# Patient Record
Sex: Female | Born: 1990 | Race: White | Hispanic: No | Marital: Married | Smoking: Never smoker
Health system: Southern US, Community
[De-identification: ages and names within clinical notes are randomized; demographics above are authoritative.]

## PROBLEM LIST (undated history)

## (undated) ENCOUNTER — Inpatient Hospital Stay (HOSPITAL_COMMUNITY): Payer: BC Managed Care – PPO

## (undated) DIAGNOSIS — N200 Calculus of kidney: Secondary | ICD-10-CM

## (undated) DIAGNOSIS — G43909 Migraine, unspecified, not intractable, without status migrainosus: Secondary | ICD-10-CM

## (undated) HISTORY — DX: Calculus of kidney: N20.0

## (undated) HISTORY — PX: WISDOM TOOTH EXTRACTION: SHX21

## (undated) HISTORY — DX: Migraine, unspecified, not intractable, without status migrainosus: G43.909

---
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
## 2011-03-12 LAB — PREGNANCY, URINE: Preg Test, Ur: NEGATIVE

## 2011-07-02 ENCOUNTER — Ambulatory Visit (INDEPENDENT_AMBULATORY_CARE_PROVIDER_SITE_OTHER): Payer: 59

## 2011-07-02 DIAGNOSIS — Z309 Encounter for contraceptive management, unspecified: Secondary | ICD-10-CM

## 2011-07-02 DIAGNOSIS — F988 Other specified behavioral and emotional disorders with onset usually occurring in childhood and adolescence: Secondary | ICD-10-CM

## 2011-07-13 ENCOUNTER — Encounter (HOSPITAL_COMMUNITY): Payer: Self-pay

## 2011-07-13 ENCOUNTER — Emergency Department (HOSPITAL_COMMUNITY)
Admission: EM | Admit: 2011-07-13 | Discharge: 2011-07-13 | Disposition: A | Discharge status: Home / Self Care | Payer: Self-pay | Source: Home / Self Care | Attending: Family Medicine | Admitting: Family Medicine

## 2011-07-13 DIAGNOSIS — B009 Herpesviral infection, unspecified: Secondary | ICD-10-CM

## 2011-07-13 MED ORDER — VALACYCLOVIR HCL 1 G PO TABS: 1000.0000 mg | ORAL_TABLET | Freq: Three times a day (TID) | ORAL | Status: DC

## 2011-07-13 MED ORDER — VALACYCLOVIR HCL 1 G PO TABS: 1000.0000 mg | ORAL_TABLET | Freq: Three times a day (TID) | ORAL | Status: AC

## 2011-07-13 NOTE — ED Provider Notes (Signed)
History     CSN: [id number]  Arrival date & time 07/13/11  0944   None     No chief complaint on file.   (Consider location/radiation/quality/duration/timing/severity/associated sxs/prior treatment) HPI Comments: Jamie Simmons presents for evaluation of a recurrent skin rash. She reports, that she's had recurrences of the rash multiple times over the last 3 years. She reports that the rash starts as a simple pimple, but then worsens into crusting vesicular lesions that itch. She reports having fever blisters once around her mouth when a friend gave her acyclovir at help. She denies any fever.  Patient is a 21 y.o. female presenting with rash. The history is provided by the patient.  Rash  This is a recurrent problem. The current episode started more than 2 days ago. The problem has not changed since onset.The problem is associated with nothing. There has been no fever. The rash is present on the left hand, abdomen, left arm and right arm. The patient is experiencing no pain. Associated symptoms include blisters, itching and weeping. Pertinent negatives include no pain. She has tried antihistamines and steriods for the symptoms. The treatment provided no relief.    No past medical history on file.  No past surgical history on file.  No family history on file.  History  Substance Use Topics  . Smoking status: Not on file  . Smokeless tobacco: Not on file  . Alcohol Use: Not on file    OB History    No data available      Review of Systems  Constitutional: Negative.   HENT: Negative.   Eyes: Negative.   Respiratory: Negative.   Cardiovascular: Negative.   Gastrointestinal: Negative.   Genitourinary: Negative.   Musculoskeletal: Negative.   Skin: Positive for itching and rash.  Neurological: Negative.     Allergies  Review of patient's allergies indicates not on file.  Home Medications   Current Outpatient Rx  Name Route Sig Dispense Refill  . VALACYCLOVIR HCL 1 G PO  TABS Oral Take 1 tablet (1,000 mg total) by mouth 3 (three) times daily. For 3 to 5 days 30 tablet 1    BP 116/81  Pulse 81  Temp(Src) 98.9 F (37.2 C) (Oral)  Resp 16  SpO2 100%  Physical Exam  Nursing note and vitals reviewed. Constitutional: She is oriented to person, place, and time. She appears well-developed and well-nourished.  HENT:  Head: Normocephalic and atraumatic.  Eyes: EOM are normal.  Neck: Normal range of motion.  Pulmonary/Chest: Effort normal.  Musculoskeletal: Normal range of motion.  Neurological: She is alert and oriented to person, place, and time.  Skin: Skin is warm and dry. Rash noted. Rash is vesicular.     Psychiatric: Her behavior is normal.    ED Course  Procedures (including critical care time)  Labs Reviewed - No data to display No results found.   1. Herpes simplex type 1 infection       MDM  rx given for valacyclovir PRN outbreaks        Richardo Priest, MD 07/13/11 1153

## 2011-07-13 NOTE — ED Notes (Signed)
Pt was seen by Dr Juanetta Gosling and discharged at time of triage at 1200

## 2011-07-13 NOTE — ED Notes (Signed)
Pt here for recurrent vesicular rash to lt hand, forearms and rt hip area.

## 2011-08-02 ENCOUNTER — Telehealth: Payer: Self-pay

## 2011-08-02 MED ORDER — AMPHETAMINE-DEXTROAMPHETAMINE 15 MG PO TABS: 15.0000 mg | ORAL_TABLET | Freq: Two times a day (BID) | ORAL | Status: DC

## 2011-08-02 NOTE — Telephone Encounter (Signed)
Notified pt Rx ready for p/up 

## 2011-08-02 NOTE — Telephone Encounter (Signed)
Chelle, do you want to increase strength?

## 2011-08-02 NOTE — Telephone Encounter (Signed)
New rx written, at 15 mg BID dose.  Please advise patient that she needs an OV for her next fill.

## 2011-08-02 NOTE — Telephone Encounter (Signed)
.  [facility]     PT REQUESTING REFILL FOR ADDERALL,ALSO WANTS TO CHANGE DOSAGE FROM 10 TO 15 MG TWICE DAILY,   BEST PHONE [phone] TO LEAVE MESSAGE

## 2011-08-30 ENCOUNTER — Telehealth: Payer: Self-pay

## 2011-08-30 NOTE — Telephone Encounter (Signed)
PT REQUESTING ADDERALL REFILL    BEST PHONE [phone]

## 2011-08-30 NOTE — Telephone Encounter (Signed)
Please pull chart and send message to pa pool. Thanks

## 2011-08-30 NOTE — Telephone Encounter (Signed)
Patient only seen in Epic

## 2011-09-01 MED ORDER — AMPHETAMINE-DEXTROAMPHETAMINE 15 MG PO TABS: 15.0000 mg | ORAL_TABLET | Freq: Two times a day (BID) | ORAL | Status: DC

## 2011-09-01 NOTE — Telephone Encounter (Signed)
Notified that Rx is ready for p/up

## 2011-09-01 NOTE — Telephone Encounter (Signed)
Signed at [employer].  Will need OV next month.

## 2011-09-01 NOTE — Telephone Encounter (Signed)
.  [facility] Patient's mother called to state that patient is out of Adderall and has none for school tomorrow.  Patient's mother states that patient normally sees Porfirio Oar, [facility] or Dr. Merla Riches.  Patient may be reached at [phone] regarding this matter.

## 2011-09-29 ENCOUNTER — Ambulatory Visit: Payer: 59

## 2011-09-29 ENCOUNTER — Ambulatory Visit (INDEPENDENT_AMBULATORY_CARE_PROVIDER_SITE_OTHER): Payer: 59 | Admitting: Family Medicine

## 2011-09-29 VITALS — BP 116/79 | HR 76 | Temp 99.2°F | Resp 16 | Ht 64.75 in | Wt 178.0 lb

## 2011-09-29 DIAGNOSIS — Z3049 Encounter for surveillance of other contraceptives: Secondary | ICD-10-CM

## 2011-09-29 DIAGNOSIS — M25569 Pain in unspecified knee: Secondary | ICD-10-CM

## 2011-09-29 DIAGNOSIS — Z3042 Encounter for surveillance of injectable contraceptive: Secondary | ICD-10-CM

## 2011-09-29 DIAGNOSIS — F988 Other specified behavioral and emotional disorders with onset usually occurring in childhood and adolescence: Secondary | ICD-10-CM | POA: Insufficient documentation

## 2011-09-29 MED ORDER — AMPHETAMINE-DEXTROAMPHETAMINE 15 MG PO TABS: 15.0000 mg | ORAL_TABLET | Freq: Every day | ORAL | Status: DC

## 2011-09-29 MED ORDER — MEDROXYPROGESTERONE ACETATE 150 MG/ML IM SUSP
150.0000 mg | Freq: Once | INTRAMUSCULAR | Status: AC
  Administered 2011-09-29: 150 mg via INTRAMUSCULAR

## 2011-09-29 MED ORDER — MELOXICAM 15 MG PO TABS: 15.0000 mg | ORAL_TABLET | Freq: Every day | ORAL | Status: DC

## 2011-09-29 MED ORDER — AMPHETAMINE-DEXTROAMPHETAMINE 15 MG PO TABS: 15.0000 mg | ORAL_TABLET | Freq: Two times a day (BID) | ORAL | Status: DC

## 2011-09-29 NOTE — Patient Instructions (Signed)
If your knee pain is not improved with this medication, please let me know, so I can refer to orthopedics.

## 2011-09-29 NOTE — Progress Notes (Signed)
  Subjective:    Patient ID: Jamie Simmons, female    DOB: 08-07-1990, 21 y.o.   MRN: [id number]  HPI  Presents for refills  Heavy bleeding x 2 weeks each month, worst cramps "ever." Flow is a little less than before.  Mood swings seem somewhat improved, but she's not really sure.  Happy with current dose of Adderall.  Has four days of classes left this semester.  Left knee pain x 4 weeks after crossing the left leg over the right "hard" while sitting in the car.  The pain can be present at rest, and with activity, though doesn't always worsen with activity.  Advil without benefit ("I think I'm immune to it.  I took it a lot when I was younger. My doctor prescribed it for everything.").  Review of Systems As above.     Objective:   Physical Exam  Vital signs noted. Well-developed, well nourished WF who is awake, alert and oriented, in NAD. HEENT: [place]/AT, sclera and conjunctiva are clear.   Extremities: no cyanosis, clubbing or edema. No erythema.Tenderness with palpation of the lateral knee, worst along the lower patellar margin. No crepitus. FROM without pain. Skin: warm and dry without rash.  Left Knee: [facility] reading (PRIMARY) by  Dr. Milus Glazier. Normal knee.       Assessment & Plan:   1. Encounter for Depo-Provera contraception  medroxyPROGESTERone (DEPO-PROVERA) injection 150 mg, repeat 3 months  2. ADD (attention deficit disorder)  amphetamine-dextroamphetamine (ADDERALL, 15MG ,) 15 MG tablet, 3 rx's written. Re-eval in 3 months  3. Knee pain  DG Knee Complete 4 Views Left, meloxicam (MOBIC) 15 MG tablet; if not improving with quad strengthening and meloxicam, call.  Will refer to orthopedics.

## 2011-10-29 ENCOUNTER — Telehealth: Payer: Self-pay

## 2011-10-29 ENCOUNTER — Encounter (HOSPITAL_COMMUNITY): Payer: Self-pay | Admitting: Emergency Medicine

## 2011-10-29 ENCOUNTER — Emergency Department (HOSPITAL_COMMUNITY): Payer: 59

## 2011-10-29 ENCOUNTER — Emergency Department (HOSPITAL_COMMUNITY)
Admission: EM | Admit: 2011-10-29 | Discharge: 2011-10-30 | Disposition: A | Discharge status: Home / Self Care | Payer: 59 | Attending: Emergency Medicine | Admitting: Emergency Medicine

## 2011-10-29 DIAGNOSIS — Z79899 Other long term (current) drug therapy: Secondary | ICD-10-CM | POA: Insufficient documentation

## 2011-10-29 DIAGNOSIS — R51 Headache: Secondary | ICD-10-CM | POA: Insufficient documentation

## 2011-10-29 DIAGNOSIS — R200 Anesthesia of skin: Secondary | ICD-10-CM

## 2011-10-29 DIAGNOSIS — M549 Dorsalgia, unspecified: Secondary | ICD-10-CM | POA: Insufficient documentation

## 2011-10-29 DIAGNOSIS — R42 Dizziness and giddiness: Secondary | ICD-10-CM | POA: Insufficient documentation

## 2011-10-29 DIAGNOSIS — F988 Other specified behavioral and emotional disorders with onset usually occurring in childhood and adolescence: Secondary | ICD-10-CM | POA: Insufficient documentation

## 2011-10-29 DIAGNOSIS — R079 Chest pain, unspecified: Secondary | ICD-10-CM | POA: Insufficient documentation

## 2011-10-29 DIAGNOSIS — R209 Unspecified disturbances of skin sensation: Secondary | ICD-10-CM | POA: Insufficient documentation

## 2011-10-29 DIAGNOSIS — H538 Other visual disturbances: Secondary | ICD-10-CM | POA: Insufficient documentation

## 2011-10-29 LAB — POCT I-STAT, CHEM 8
BUN: 9 mg/dL (ref 6–23)
Calcium, Ion: 1.24 mmol/L (ref 1.12–1.32)
HCT: 42 % (ref 36.0–46.0)
Hemoglobin: 14.3 g/dL (ref 12.0–15.0)
TCO2: 25 mmol/L (ref 0–100)

## 2011-10-29 LAB — POCT I-STAT TROPONIN I: Troponin i, poc: 0 ng/mL (ref 0.00–0.08)

## 2011-10-29 LAB — POCT PREGNANCY, URINE: Preg Test, Ur: NEGATIVE

## 2011-10-29 MED ORDER — AMPHETAMINE-DEXTROAMPHETAMINE 15 MG PO TABS: 15.0000 mg | ORAL_TABLET | Freq: Two times a day (BID) | ORAL | Status: DC

## 2011-10-29 NOTE — Telephone Encounter (Signed)
PATIENTS MOTHER CALLED ABOUT HER DAUGHTERS ADDERALL PRESCRIPTION.  SAYS THE FIRST ONE IS CORRECT, BUT THE 2ND TWO HAVE THE WRONG DIRECTIONS ON IT.  THE QUANTITY IS CORRECT, BUT IT SAYS TO TAKE 1 PER DAY.  SHE IS SUPPOSED TO TAKE TWO PER DAY.

## 2011-10-29 NOTE — Telephone Encounter (Signed)
Patient brought in original Rx's that had the incorrect sig. Rx's corrected and exchanged.

## 2011-10-29 NOTE — ED Provider Notes (Signed)
History     CSN: [id number]  Arrival date & time 10/29/11  6578   First MD Initiated Contact with Patient 10/29/11 2203      Chief Complaint  Patient presents with  . Chest Pain    (Consider location/radiation/quality/duration/timing/severity/associated sxs/prior treatment) HPI This 21 year old female has had about 4 episodes of left-sided facial numbness of the last month. Her prior 3 episodes were gradual in onset lasted a few hours and resolved. This current spell started yesterday gradually during the day and has been present all day yesterday and all day today with slight numbness to either partial or the entire left side of her face waxing and waning in its pattern and associated with only very slight headache gradual onset today which is essentially gone now. There is no sudden or severe headache. There's been no change in speech vision swallowing or understanding. There is no weakness or numbness or incoordination to her arms or legs. She is walking normally. She does however have 2 days of a general lightheaded feeling which is not associated with any change in position or exertion or activity. She also today has about 4 hours of a sudden onset and mild sharp chest pain over her entire chest radiating to her back with no palpitations cough fever shortness of breath nausea or diaphoresis. She has no pleuritic positional or exertional component. She has no leg pains or leg swelling or leg tenderness. She has no travel or trauma. She is PERC negative. Past Medical History  Diagnosis Date  . Attention deficit disorder     History reviewed. No pertinent past surgical history.  No family history on file.  History  Substance Use Topics  . Smoking status: Never Smoker   . Smokeless tobacco: Not on file  . Alcohol Use: No    OB History    Grav Para Term Preterm Abortions TAB SAB Ect Mult Living                  Review of Systems  Constitutional: Negative for fever.       10  Systems reviewed and are negative for acute change except as noted in the HPI.  HENT: Negative for congestion.   Eyes: Negative for discharge and redness.  Respiratory: Negative for cough and shortness of breath.   Cardiovascular: Positive for chest pain.  Gastrointestinal: Negative for vomiting and abdominal pain.  Musculoskeletal: Negative for back pain.  Skin: Negative for rash.  Neurological: Positive for numbness and headaches. Negative for syncope.  Psychiatric/Behavioral:       No behavior change.    Allergies  Review of patient's allergies indicates no known allergies.  Home Medications   Current Outpatient Rx  Name Route Sig Dispense Refill  . AMPHETAMINE-DEXTROAMPHETAMINE 15 MG PO TABS Oral Take 1 tablet (15 mg total) by mouth 2 (two) times daily. 60 tablet 0  . MEDROXYPROGESTERONE ACETATE 150 MG/ML IM SUSP Intramuscular Inject 150 mg into the muscle every 3 (three) months.      BP 127/67  Pulse 94  Temp(Src) 98.9 F (37.2 C) (Oral)  Resp 20  Ht 5\' 6"  (1.676 m)  Wt 170 lb (77.111 kg)  BMI 27.44 kg/m2  SpO2 100%  Physical Exam  Nursing note and vitals reviewed. Constitutional:       Awake, alert, nontoxic appearance with baseline speech for patient.  HENT:  Head: Atraumatic.  Mouth/Throat: No oropharyngeal exudate.  Eyes: EOM are normal. Pupils are equal, round, and reactive to light. Right eye  exhibits no discharge. Left eye exhibits no discharge.  Neck: Neck supple.  Cardiovascular: Normal rate and regular rhythm.   No murmur heard. Pulmonary/Chest: Effort normal and breath sounds normal. No stridor. No respiratory distress. She has no wheezes. She has no rales. She exhibits no tenderness.  Abdominal: Soft. Bowel sounds are normal. She exhibits no mass. There is no tenderness. There is no rebound.  Musculoskeletal: She exhibits no tenderness.       Baseline ROM, moves extremities with no obvious new focal weakness.  Lymphadenopathy:    She has no cervical  adenopathy.  Neurological: She is alert.       Awake, alert, cooperative and aware of situation; motor strength bilaterally; sensation normal to light touch bilaterally; peripheral visual fields full to confrontation; no facial asymmetry; tongue midline; major cranial nerves appear intact; no pronator drift, normal finger to nose bilaterally, the patient walked normally into the ED according to patient and her family  Skin: No rash noted.  Psychiatric: She has a normal mood and affect.    ED Course  Procedures (including critical care time) ECG: Normal sinus rhythm, ventricular rate 94, normal axis, normal intervals, no acute ischemic changes noted, impression normal ECG, no comparison ECG available  D/w neuro agree no CT/MR tonight and OutPt f/u reasonable, mom with complicated migraines with unilateral facial numbness and/or unilateral blurred vision   Labs Reviewed  POCT PREGNANCY, URINE  POCT I-STAT, CHEM 8  POCT I-STAT TROPONIN I  LAB REPORT - SCANNED   No results found.   1. Chest pain   2. Left facial numbness       MDM  Pt stable in ED with no significant deterioration in condition.Patient / Family / Caregiver informed of clinical course, understand medical decision-making process, and agree with plan.I doubt any other EMC precluding discharge at this time including, but not necessarily limited to the following:SAH, CVA, PE.        Hurman Horn, MD 11/01/11 [phone]

## 2011-10-29 NOTE — ED Notes (Signed)
Pt alert, nad, arrives with family from home, c/o mid sternal chest pain, radiating to back, onset this evening, states started while she was driving, resp even unlabored, skin pwd, no nausea or vomitting

## 2011-10-30 NOTE — Discharge Instructions (Signed)
Your caregiver has diagnosed you as having chest pain that is not specific for one problem, but does not require admission.  You are at low risk for an acute heart condition or other serious illness. Chest pain comes from many different causes.  SEEK IMMEDIATE MEDICAL ATTENTION IF: You have severe chest pain, especially if the pain is crushing or pressure-like and spreads to the arms, back, neck, or jaw, or if you have sweating, nausea (feeling sick to your stomach), or shortness of breath. THIS IS AN EMERGENCY. Don't wait to see if the pain will go away. Get medical help at once. Call 911 or 0 (operator). DO NOT drive yourself to the hospital.  Your chest pain gets worse and does not go away with rest.  You have an attack of chest pain lasting longer than usual, despite rest and treatment with the medications your caregiver has prescribed.  You wake from sleep with chest pain or shortness of breath.  You feel dizzy or faint.  You have chest pain not typical of your usual pain for which you originally saw your caregiver.  Your caregiver has seen you today because you are having problems with feelings of dizziness, and numbness. Your caregiver has considered some of the most common causes of numbness and feels it is safe for you to go home and be observed. Not every illness or injury can be identified during an emergency department visit, thus follow-up with your primary healthcare provider is important. Medical conditions can also worsen, so it is also important to return immediately as directed below, or if you have other serious concerns develop. RETURN IMMEDIATELY IF you develop new shortness of breath, chest pain, fever, have difficulty moving parts of your body (new weakness, numbness, or incoordination), sudden change in speech, vision, swallowing, or understanding, faint or develop new dizziness, severe headache, become poorly responsive or have an altered mental status compared to baseline for you,  new rash, abdominal pain, or bloody stools,  Return sooner also if you develop new problems for which you have not talked to your caregiver but you feel may be emergency medical conditions, or are unable to be cared for safely at home.

## 2011-12-27 ENCOUNTER — Encounter: Payer: Self-pay | Admitting: Physician Assistant

## 2011-12-27 ENCOUNTER — Ambulatory Visit (INDEPENDENT_AMBULATORY_CARE_PROVIDER_SITE_OTHER): Payer: 59 | Admitting: Physician Assistant

## 2011-12-27 VITALS — BP 131/78 | HR 91 | Temp 98.8°F | Resp 18 | Ht 65.0 in | Wt 176.0 lb

## 2011-12-27 DIAGNOSIS — R300 Dysuria: Secondary | ICD-10-CM

## 2011-12-27 DIAGNOSIS — F988 Other specified behavioral and emotional disorders with onset usually occurring in childhood and adolescence: Secondary | ICD-10-CM

## 2011-12-27 DIAGNOSIS — R3915 Urgency of urination: Secondary | ICD-10-CM

## 2011-12-27 DIAGNOSIS — R350 Frequency of micturition: Secondary | ICD-10-CM

## 2011-12-27 DIAGNOSIS — N946 Dysmenorrhea, unspecified: Secondary | ICD-10-CM

## 2011-12-27 LAB — POCT URINALYSIS DIPSTICK
Bilirubin, UA: NEGATIVE
Blood, UA: NEGATIVE
Nitrite, UA: NEGATIVE
pH, UA: 6

## 2011-12-27 LAB — POCT UA - MICROSCOPIC ONLY
Crystals, Ur, HPF, POC: NEGATIVE
Yeast, UA: NEGATIVE

## 2011-12-27 MED ORDER — AMPHETAMINE-DEXTROAMPHETAMINE 15 MG PO TABS: 15.0000 mg | ORAL_TABLET | Freq: Two times a day (BID) | ORAL | Status: DC

## 2011-12-27 MED ORDER — MEDROXYPROGESTERONE ACETATE 150 MG/ML IM SUSP
150.0000 mg | INTRAMUSCULAR | Status: DC
  Administered 2011-12-27: 150 mg via INTRAMUSCULAR

## 2011-12-27 NOTE — Patient Instructions (Signed)
Drink at least 64 ounces of water daily and return if your symptoms don't resolve.

## 2011-12-28 DIAGNOSIS — N946 Dysmenorrhea, unspecified: Secondary | ICD-10-CM | POA: Insufficient documentation

## 2011-12-28 NOTE — Progress Notes (Signed)
Subjective:    Patient ID: Jamie Simmons, female    DOB: April 05, 1991, 21 y.o.   MRN: [id number]  HPI  This 21 y.o. Female presents for DepoProvera injection for dysmenorrhea, re-evaluation of ADD and refill of Adderall.  Overall she is happy with DepoProvera, except that her last period lasted 2 weeks.  She was initially amenorrheic, and enjoyed that.  Her current dose of Adderall is working well for her.  Additionally, she reports that she believes she has a prolapsed bladder, or failing that, prolapsed uterus.  She has searched the internet for causes of current symptoms.  She notes several months of burning on urination.  For the past several days, she has also had urinary urgency and frequency, and yesterday had brief stronger pain in the lower abdomen with urination.  She denies vaginal discharge and itching, has not yet become sexually active, has had no fever, chills, nausea, vomiting or diarrhea.     Review of Systems As above.    Objective:   Physical Exam  Blood pressure 131/78, pulse 91, temperature 98.8 F (37.1 C), temperature source Oral, resp. rate 18, height 5\' 5"  (1.651 m), weight 176 lb (79.833 kg), last menstrual period 12/13/2011, SpO2 99.00%. Body mass index is 29.29 kg/(m^2). Well-developed, well nourished WF who is awake, alert and oriented, in NAD. HEENT: [place]/AT, sclera and conjunctiva are clear.   Neck: supple, non-tender, no lymphadenopathy, thyromegaly. Heart: RRR, no murmur Lungs: CTA Abdomen: normo-active bowel sounds, supple, non-tender, no mass or organomegaly. Extremities: no cyanosis, clubbing or edema. Skin: warm and dry without rash.  She declines vaginal exam, even external inspection.  Results for orders placed in visit on 12/27/11  POCT UA - MICROSCOPIC ONLY      Component Value Range   WBC, Ur, HPF, POC 0-5     RBC, urine, microscopic 0-2     Bacteria, U Microscopic trace     Mucus, UA moderate     Epithelial cells, urine per micros 2-6       Crystals, Ur, HPF, POC neg     Casts, Ur, LPF, POC neg     Yeast, UA neg    POCT URINALYSIS DIPSTICK      Component Value Range   Color, UA dark yellow     Clarity, UA clear     Glucose, UA neg     Bilirubin, UA neg     Ketones, UA trace     Spec Grav, UA >=1.030     Blood, UA neg     pH, UA 6.0     Protein, UA trace     Urobilinogen, UA 2.0     Nitrite, UA neg     Leukocytes, UA Negative    GLUCOSE, POCT (MANUAL RESULT ENTRY)      Component Value Range   POC Glucose 98  70 - 99 mg/dl       Assessment & Plan:   1. Dysuria  POCT UA - Microscopic Only, POCT urinalysis dipstick  2. ADD (attention deficit disorder)  amphetamine-dextroamphetamine (ADDERALL) 15 MG tablet  3. Dysmenorrhea  medroxyPROGESTERone (DEPO-PROVERA) injection 150 mg  4. Urinary frequency  POCT glucose (manual entry)  5. Urinary urgency     She will increase her oral fluids and RTC if her urinary symptoms persist, though she is reasurred that she is very low risk for prolapse of the bladder and uterus.    She can call for 3 more Rx of Adderall in 3 months, and RTC  for re-eval in 6 months. RTC 3 months for DepoProvera-standing orders are placed.

## 2012-02-06 DIAGNOSIS — N200 Calculus of kidney: Secondary | ICD-10-CM

## 2012-02-06 HISTORY — DX: Calculus of kidney: N20.0

## 2012-02-29 ENCOUNTER — Ambulatory Visit (INDEPENDENT_AMBULATORY_CARE_PROVIDER_SITE_OTHER): Payer: 59 | Admitting: Emergency Medicine

## 2012-02-29 VITALS — BP 126/76 | HR 94 | Temp 99.2°F | Resp 16 | Ht 66.0 in | Wt 178.0 lb

## 2012-02-29 DIAGNOSIS — R109 Unspecified abdominal pain: Secondary | ICD-10-CM

## 2012-02-29 DIAGNOSIS — N201 Calculus of ureter: Secondary | ICD-10-CM

## 2012-02-29 LAB — POCT URINE PREGNANCY: Preg Test, Ur: NEGATIVE

## 2012-02-29 LAB — POCT URINALYSIS DIPSTICK
Glucose, UA: NEGATIVE
Ketones, UA: NEGATIVE
Spec Grav, UA: 1.02

## 2012-02-29 MED ORDER — ONDANSETRON 8 MG PO TBDP: 8.0000 mg | ORAL_TABLET | Freq: Three times a day (TID) | ORAL | Status: DC | PRN

## 2012-02-29 MED ORDER — OXYCODONE-ACETAMINOPHEN 5-325 MG PO TABS: 1.0000 | ORAL_TABLET | ORAL | Status: DC | PRN

## 2012-02-29 NOTE — Patient Instructions (Signed)

## 2012-02-29 NOTE — Progress Notes (Signed)
Date:  02/29/2012   Name:  Jamie Simmons   DOB:  1990/10/11   MRN:  [id number] Gender: female Age: 21 y.o.  PCP:  No primary provider on file.    Chief Complaint: Flank Pain   History of Present Illness:  Jamie Simmons is a 21 y.o. pleasant patient who presents with the following:  Awakened from a deep sleep with pain in her left flank radiating around to the LLQ.  Nauseated no vomiting.  No fever or chills.  Pain is pretty much constant and has worsened over the past 12 hours.  No dysuria, urgency or frequency.  No hematuria.  No prior history of stone disease.  No GYN or GI symptoms.  No improvement with OTC meds.  Patient Active Problem List  Diagnosis  . ADD (attention deficit disorder)  . Dysmenorrhea    Past Medical History  Diagnosis Date  . Attention deficit disorder     No past surgical history on file.  History  Substance Use Topics  . Smoking status: Never Smoker   . Smokeless tobacco: Not on file  . Alcohol Use: No    No family history on file.  No Known Allergies  Medication list has been reviewed and updated.  Outpatient Prescriptions Prior to Visit  Medication Sig Dispense Refill  . amphetamine-dextroamphetamine (ADDERALL) 15 MG tablet Take 1 tablet (15 mg total) by mouth 2 (two) times daily.  60 tablet  0  . amphetamine-dextroamphetamine (ADDERALL) 15 MG tablet Take 1 tablet (15 mg total) by mouth 2 (two) times daily. May fill on/after 01/26/2012  60 tablet  0  . amphetamine-dextroamphetamine (ADDERALL) 15 MG tablet Take 1 tablet (15 mg total) by mouth 2 (two) times daily. May fill on/after 02/25/2012  60 tablet  0  . medroxyPROGESTERone (DEPO-PROVERA) 150 MG/ML injection Inject 150 mg into the muscle every 3 (three) months.       Facility-Administered Medications Prior to Visit  Medication Dose Route Frequency Provider Last Rate Last Dose  . medroxyPROGESTERone (DEPO-PROVERA) injection 150 mg  150 mg Intramuscular Q90 days Fernande Bras,  PA-C   150 mg at 12/27/11 1630    Review of Systems:  As per HPI, otherwise negative.    Physical Examination: Filed Vitals:   02/29/12 1920  BP: 126/76  Pulse: 94  Temp: 99.2 F (37.3 C)  Resp: 16   Filed Vitals:   02/29/12 1920  Height: 5\' 6"  (1.676 m)  Weight: 178 lb (80.74 kg)   Body mass index is 28.73 kg/(m^2). Ideal Body Weight: Weight in (lb) to have BMI = 25: 154.6   GEN: WDWN, NAD, Non-toxic, A & O x 3 HEENT: Atraumatic, Normocephalic. Neck supple. No masses, No LAD. Ears and Nose: No external deformity. CV: RRR, No M/G/R. No JVD. No thrill. No extra heart sounds. PULM: CTA B, no wheezes, crackles, rhonchi. No retractions. No resp. distress. No accessory muscle use. ABD: S, NT, ND, +BS. No rebound. No HSM.  Left flank tenderness and Left CVA tenderness EXTR: No c/c/e NEURO Normal gait.  PSYCH: Normally interactive. Conversant. Not depressed or anxious appearing.  Calm demeanor.    Assessment and Plan: Ureterolithiasis   Carmelina Dane, MD I have reviewed and agree with documentation. Robert P. Merla Riches, M.D.   Results for orders placed in visit on 02/29/12  POCT URINALYSIS DIPSTICK      Component Value Range   Color, UA yellow     Clarity, UA clear     Glucose, UA  neg     Bilirubin, UA neg     Ketones, UA neg     Spec Grav, UA 1.020     Blood, UA large     pH, UA 7.0     Protein, UA neg     Urobilinogen, UA 2.0     Nitrite, UA neg     Leukocytes, UA Negative    POCT URINE PREGNANCY      Component Value Range   Preg Test, Ur Negative     Meds ordered this encounter  Medications  . oxyCODONE-acetaminophen (ROXICET) 5-325 MG per tablet    Sig: Take 1 tablet by mouth every 4 (four) hours as needed for pain.    Dispense:  30 tablet    Refill:  0  . ondansetron (ZOFRAN-ODT) 8 MG disintegrating tablet    Sig: Take 1 tablet (8 mg total) by mouth every 8 (eight) hours as needed for nausea.    Dispense:  30 tablet    Refill:  0   To f/u  48-72h if not passed I have reviewed and agree with documentation. Robert P. Merla Riches, M.D.

## 2012-03-21 ENCOUNTER — Ambulatory Visit: Payer: 59

## 2012-03-21 ENCOUNTER — Ambulatory Visit (INDEPENDENT_AMBULATORY_CARE_PROVIDER_SITE_OTHER): Payer: 59 | Admitting: Physician Assistant

## 2012-03-21 ENCOUNTER — Encounter: Payer: Self-pay | Admitting: Physician Assistant

## 2012-03-21 VITALS — BP 120/66 | HR 99 | Temp 98.7°F | Resp 18 | Ht 65.5 in | Wt 176.0 lb

## 2012-03-21 DIAGNOSIS — G4700 Insomnia, unspecified: Secondary | ICD-10-CM

## 2012-03-21 DIAGNOSIS — F988 Other specified behavioral and emotional disorders with onset usually occurring in childhood and adolescence: Secondary | ICD-10-CM

## 2012-03-21 DIAGNOSIS — N946 Dysmenorrhea, unspecified: Secondary | ICD-10-CM

## 2012-03-21 MED ORDER — CLONAZEPAM 0.5 MG PO TABS: 0.2500 mg | ORAL_TABLET | Freq: Every evening | ORAL | Status: DC | PRN

## 2012-03-21 MED ORDER — AMPHETAMINE-DEXTROAMPHETAMINE 30 MG PO TABS: 15.0000 mg | ORAL_TABLET | Freq: Two times a day (BID) | ORAL | Status: DC

## 2012-03-21 MED ORDER — MEDROXYPROGESTERONE ACETATE 150 MG/ML IM SUSP
150.0000 mg | Freq: Once | INTRAMUSCULAR | Status: AC
  Administered 2012-03-21: 150 mg via INTRAMUSCULAR

## 2012-03-21 NOTE — Patient Instructions (Signed)
Return 06/06/2012-06/20/2012 for your next DepoProvera injection.

## 2012-03-21 NOTE — Progress Notes (Signed)
  Subjective:    Patient ID: Jamie Simmons, female    DOB: [birth year], 21 y.o.   MRN: [id number]  HPI This 21 y.o. female presents for evaluation of ADD, and for DepoProvera injection for treatment of dysmenorrhea.  Her mood and cramps are much better, but she has many days of bleeding.  Most are light.  Occasional days are heavy bleeding.  Overwhelmed feeling.  Brain keeps her awake thinking about things.  Different than her ADD. She has taken some alprazolam with relief.  She is concerned about medications that may prevent her acceptance into the [facility]. She has tried citalopram in the past, without benefit.  She has heard that the use of antidepressants can be a disadvantage when competing against applicants without such medications.  No SI/HI.  Review of Systems  As above.  Past Medical History  Diagnosis Date  . Attention deficit disorder   . Nephrolithiasis 02/2012    History reviewed. No pertinent past surgical history.  Prior to Admission medications   Medication Sig Start Date End Date Taking? Authorizing Provider  amphetamine-dextroamphetamine (ADDERALL) 15 MG tablet Take 1 tablet (15 mg total) by mouth 2 (two) times daily. 12/27/11  Yes Tonnya Garbett S Pierce Barocio, PA-C  medroxyPROGESTERone (DEPO-PROVERA) 150 MG/ML injection Inject 150 mg into the muscle every 3 (three) months.   Yes Historical Provider, MD  amphetamine-dextroamphetamine (ADDERALL) 15 MG tablet Take 1 tablet (15 mg total) by mouth 2 (two) times daily. May fill on/after 01/26/2012 12/27/11   Izmael Duross S Fanny Agan, PA-C  amphetamine-dextroamphetamine (ADDERALL) 15 MG tablet Take 1 tablet (15 mg total) by mouth 2 (two) times daily. May fill on/after 02/25/2012 12/27/11   Letica Giaimo Tessa Lerner, PA-C    No Known Allergies  History   Social History  . Marital Status: Single    Spouse Name: n/a    Number of Children: 0  . Years of Education: N/A   Occupational History  . Student    Social History Main Topics  .  Smoking status: Never Smoker   . Smokeless tobacco: Never Used  . Alcohol Use: No  . Drug Use: No  . Sexually Active: Not on file   Other Topics Concern  . Not on file   Social History Narrative   Lives with mother and mother's wife, and younger sister.    History reviewed. No pertinent family history.     Objective:   Physical Exam  Blood pressure 120/66, pulse 99, temperature 98.7 F (37.1 C), resp. rate 18, height 5' 5.5" (1.664 m), weight 176 lb (79.833 kg), last menstrual period 02/29/2012, SpO2 100.00%. Body mass index is 28.84 kg/(m^2). Well-developed, well nourished WF who is awake, alert and oriented, in NAD. HEENT: [place]/AT, sclera and conjunctiva are clear.   Lungs: normal effort Skin: warm and dry without rash. Psychologic: good mood and appropriate affect, normal speech and behavior.     Assessment & Plan:   1. ADD (attention deficit disorder)  amphetamine-dextroamphetamine (ADDERALL) 30 MG tablet, amphetamine-dextroamphetamine (ADDERALL) 30 MG tablet, amphetamine-dextroamphetamine (ADDERALL) 30 MG tablet  2. Dysmenorrhea  medroxyPROGESTERone (DEPO-PROVERA) injection 150 mg  3. Insomnia  Clonazepam 0.5 mg, 1/2-1 PO QHS prn.  If she's needing this more than 2 x weekly, we'll need to discuss alternatives.

## 2012-06-20 ENCOUNTER — Ambulatory Visit (INDEPENDENT_AMBULATORY_CARE_PROVIDER_SITE_OTHER): Payer: 59 | Admitting: Physician Assistant

## 2012-06-20 DIAGNOSIS — Z309 Encounter for contraceptive management, unspecified: Secondary | ICD-10-CM

## 2012-06-20 MED ORDER — MEDROXYPROGESTERONE ACETATE 150 MG/ML IM SUSP: 150.0000 mg | Freq: Once | INTRAMUSCULAR | Status: DC

## 2012-06-20 NOTE — Progress Notes (Signed)
  Subjective:    Patient ID: Jamie Simmons, female    DOB: 1990/08/17, 22 y.o.   MRN: [id number]  HPI Patient presents for depo provera she is within proper time frame for her contraception management.    Review of Systems     Objective:   Physical Exam After verbal consent obtained, patient was given depo provera injection today and instructed to follow up for next between April 1st and April 15th 2014. Patient tolerated well.        Assessment & Plan:  Follow up as above. Meds ordered this encounter  Medications  . medroxyPROGESTERone (DEPO-PROVERA) injection 150 mg    Sig:

## 2012-06-27 ENCOUNTER — Other Ambulatory Visit: Payer: Self-pay | Admitting: Physician Assistant

## 2012-06-27 ENCOUNTER — Other Ambulatory Visit: Payer: Self-pay

## 2012-06-27 DIAGNOSIS — F988 Other specified behavioral and emotional disorders with onset usually occurring in childhood and adolescence: Secondary | ICD-10-CM

## 2012-06-27 NOTE — Telephone Encounter (Signed)
Needs adderall refilled  Please call when ready.  [phone]

## 2012-06-27 NOTE — Telephone Encounter (Signed)
Pended, please advise

## 2012-06-28 ENCOUNTER — Telehealth: Payer: Self-pay

## 2012-06-28 MED ORDER — AMPHETAMINE-DEXTROAMPHETAMINE 30 MG PO TABS: 15.0000 mg | ORAL_TABLET | Freq: Two times a day (BID) | ORAL | Status: DC

## 2012-06-28 NOTE — Telephone Encounter (Signed)
Called pt to let her know RX ready for pick up. Unable to leave message. Rx in pick up draw

## 2012-07-03 NOTE — Telephone Encounter (Signed)
This was put up front, Kaylie Ritter picked up.

## 2012-07-31 ENCOUNTER — Other Ambulatory Visit: Payer: Self-pay | Admitting: Physician Assistant

## 2012-07-31 DIAGNOSIS — F988 Other specified behavioral and emotional disorders with onset usually occurring in childhood and adolescence: Secondary | ICD-10-CM

## 2012-07-31 MED ORDER — AMPHETAMINE-DEXTROAMPHETAMINE 30 MG PO TABS: 15.0000 mg | ORAL_TABLET | Freq: Two times a day (BID) | ORAL | Status: DC

## 2012-08-24 ENCOUNTER — Other Ambulatory Visit: Payer: Self-pay | Admitting: Physician Assistant

## 2012-08-25 ENCOUNTER — Telehealth: Payer: Self-pay

## 2012-08-25 NOTE — Telephone Encounter (Addendum)
Pt states that she sent an e-chart message for a refill on her klonopin 2 days ago to [facility] but has not had a reply regarding it. Please advise. Best# [phone] [facility] long pharmacy

## 2012-08-25 NOTE — Telephone Encounter (Signed)
Done. Rx printed.  Amy Littrell aware.

## 2012-08-25 NOTE — Telephone Encounter (Signed)
Please advise 

## 2012-08-25 NOTE — Telephone Encounter (Signed)
faxed

## 2012-09-18 ENCOUNTER — Ambulatory Visit (INDEPENDENT_AMBULATORY_CARE_PROVIDER_SITE_OTHER): Payer: 59 | Admitting: Family Medicine

## 2012-09-18 VITALS — BP 120/84 | HR 96 | Temp 99.1°F | Resp 18 | Ht 65.0 in | Wt 170.0 lb

## 2012-09-18 DIAGNOSIS — N946 Dysmenorrhea, unspecified: Secondary | ICD-10-CM

## 2012-09-18 MED ORDER — MEDROXYPROGESTERONE ACETATE 150 MG/ML IM SUSP
150.0000 mg | INTRAMUSCULAR | Status: DC
  Administered 2012-09-18: 150 mg via INTRAMUSCULAR

## 2012-09-18 NOTE — Progress Notes (Signed)
  Subjective:    Patient ID: Jamie Simmons, female    DOB: Aug 27, 1990, 21 y.o.   MRN: [id number]  HPI Here for Depo shot only    Review of Systems     Objective:   Physical Exam        Assessment & Plan:

## 2012-10-01 ENCOUNTER — Ambulatory Visit (INDEPENDENT_AMBULATORY_CARE_PROVIDER_SITE_OTHER): Payer: 59 | Admitting: Physician Assistant

## 2012-10-01 VITALS — BP 122/72 | HR 112 | Temp 98.5°F | Resp 18 | Ht 65.5 in | Wt 172.0 lb

## 2012-10-01 DIAGNOSIS — F988 Other specified behavioral and emotional disorders with onset usually occurring in childhood and adolescence: Secondary | ICD-10-CM

## 2012-10-01 DIAGNOSIS — H01119 Allergic dermatitis of unspecified eye, unspecified eyelid: Secondary | ICD-10-CM

## 2012-10-01 DIAGNOSIS — G4700 Insomnia, unspecified: Secondary | ICD-10-CM

## 2012-10-01 MED ORDER — AMPHETAMINE-DEXTROAMPHETAMINE 30 MG PO TABS: 15.0000 mg | ORAL_TABLET | Freq: Two times a day (BID) | ORAL | Status: DC

## 2012-10-01 MED ORDER — PREDNISONE 20 MG PO TABS: ORAL_TABLET | ORAL | Status: DC

## 2012-10-01 MED ORDER — CLONAZEPAM 0.5 MG PO TABS: 0.2500 mg | ORAL_TABLET | Freq: Every evening | ORAL | Status: DC | PRN

## 2012-10-01 NOTE — Progress Notes (Signed)
  Subjective:    Patient ID: Jamie Simmons, female    DOB: March 30, 1991, 22 y.o.   MRN: [id number]  HPI  This 22 y.o. female presents for evaluation of a possible allergic reaction of the skin around the RIGHT eye. Began  Yesterday, worse this am, but improved some since waking.  Took Benadryl, and used hydrocortisone cream and calamine lotion.  No symptoms of the globe of the eye.  Skin is dry and crusted, no drainage from the eye. 5 days ago had itching and swelling of the roof of the mouth, then the lips, now resolved.  Previous episodes of same have been associated with increased stress. 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  She requests refill of adderall for ADD. She's tolerating meds without adverse effects and is getting adequate benefit from current dose.  Additionally, she requests a refill of clonazepam to have on file.  She doesn't take a whole tablet every night, so has not yet run out.  Today she is accompanied by her mother.  Review of Systems As above. No chest pain, SOB, HA, dizziness, vision change, N/V, diarrhea, constipation, dysuria, urinary urgency or frequency, myalgias, arthralgias.     Objective:   Physical Exam Blood pressure 122/72, pulse 112, temperature 98.5 F (36.9 C), temperature source Oral, resp. rate 18, height 5' 5.5" (1.664 m), weight 172 lb (78.019 kg), SpO2 100.00%. Body mass index is 28.18 kg/(m^2). Well-developed, well nourished WF who is awake, alert and oriented, in NAD. HEENT: [place]/AT, PERRL, EOMI.  Sclera and conjunctiva are clear.  EAC are patent, TMs are normal in appearance. Nasal mucosa is pink and moist. OP is clear. The skin around the RIGHT eye notable for edema and erythema.  There are excoriated papules on the medial upper lid of BOTH eyes, R>L.  Skin around the lips is dry and cracked, but appears to be healing. Neck: supple, non-tender, no lymphadenopathy,  thyromegaly. Heart: RRR, no murmur Lungs: normal effort, CTA Abdomen: normo-active bowel sounds, supple, non-tender, no mass or organomegaly. Extremities: no cyanosis, clubbing or edema. Skin: warm and dry.  Round patch or erythematous, raised skin noted in the LEFT antecubital fossa. Psychologic: good mood and appropriate affect, normal speech and behavior.     Assessment & Plan:  Dermatitis contact, eyelid, unspecified laterality - Plan: predniSONE (DELTASONE) 20 MG tablet  ADD (attention deficit disorder) - Plan: amphetamine-dextroamphetamine (ADDERALL) 30 MG tablet, amphetamine-dextroamphetamine (ADDERALL) 30 MG tablet, amphetamine-dextroamphetamine (ADDERALL) 30 MG tablet  Insomnia - Plan: clonazePAM (KLONOPIN) 0.5 MG tablet  Fernande Bras, PA-C Physician Assistant-Certified [facility]

## 2012-10-04 ENCOUNTER — Telehealth: Payer: Self-pay | Admitting: Physician Assistant

## 2012-10-04 NOTE — Telephone Encounter (Signed)
The patient's mother brought in a prescription for this patient for Adderall 30 mg dated 07/31/2012, which duplicates another she received.  The prescription was shredded, with Angie McFarland as witness.  Additionally, she needs a letter stating that she takes clonazepam and Adderall prescriptions, as she has an upcoming drug screen.

## 2012-12-13 ENCOUNTER — Ambulatory Visit (INDEPENDENT_AMBULATORY_CARE_PROVIDER_SITE_OTHER): Payer: 59 | Admitting: *Deleted

## 2012-12-13 VITALS — BP 110/80 | HR 86 | Temp 98.0°F | Resp 16 | Wt 175.0 lb

## 2012-12-13 DIAGNOSIS — Z309 Encounter for contraceptive management, unspecified: Secondary | ICD-10-CM

## 2012-12-13 DIAGNOSIS — IMO0001 Reserved for inherently not codable concepts without codable children: Secondary | ICD-10-CM

## 2012-12-13 MED ORDER — MEDROXYPROGESTERONE ACETATE 150 MG/ML IM SUSP
150.0000 mg | Freq: Once | INTRAMUSCULAR | Status: AC
  Administered 2012-12-13: 150 mg via INTRAMUSCULAR

## 2012-12-25 ENCOUNTER — Telehealth: Payer: Self-pay | Admitting: Physician Assistant

## 2012-12-25 DIAGNOSIS — F988 Other specified behavioral and emotional disorders with onset usually occurring in childhood and adolescence: Secondary | ICD-10-CM

## 2012-12-25 MED ORDER — AMPHETAMINE-DEXTROAMPHETAMINE 30 MG PO TABS: 15.0000 mg | ORAL_TABLET | Freq: Two times a day (BID) | ORAL | Status: DC

## 2012-12-25 NOTE — Telephone Encounter (Signed)
Rx's given to mother.

## 2013-02-13 ENCOUNTER — Other Ambulatory Visit: Payer: Self-pay | Admitting: Physician Assistant

## 2013-03-13 ENCOUNTER — Ambulatory Visit (INDEPENDENT_AMBULATORY_CARE_PROVIDER_SITE_OTHER): Payer: 59 | Admitting: *Deleted

## 2013-03-13 VITALS — BP 118/72 | HR 109 | Temp 98.9°F | Resp 18 | Wt 177.0 lb

## 2013-03-13 DIAGNOSIS — Z309 Encounter for contraceptive management, unspecified: Secondary | ICD-10-CM

## 2013-03-13 MED ORDER — MEDROXYPROGESTERONE ACETATE 150 MG/ML IM SUSP
150.0000 mg | Freq: Once | INTRAMUSCULAR | Status: AC
  Administered 2013-03-13: 150 mg via INTRAMUSCULAR

## 2013-03-30 ENCOUNTER — Ambulatory Visit (INDEPENDENT_AMBULATORY_CARE_PROVIDER_SITE_OTHER): Payer: 59 | Admitting: Internal Medicine

## 2013-03-30 VITALS — BP 116/78 | HR 98 | Temp 98.7°F | Resp 20 | Ht 65.0 in | Wt 178.0 lb

## 2013-03-30 DIAGNOSIS — G4700 Insomnia, unspecified: Secondary | ICD-10-CM

## 2013-03-30 DIAGNOSIS — F988 Other specified behavioral and emotional disorders with onset usually occurring in childhood and adolescence: Secondary | ICD-10-CM

## 2013-03-30 DIAGNOSIS — R319 Hematuria, unspecified: Secondary | ICD-10-CM

## 2013-03-30 DIAGNOSIS — M549 Dorsalgia, unspecified: Secondary | ICD-10-CM

## 2013-03-30 LAB — POCT URINALYSIS DIPSTICK
Bilirubin, UA: NEGATIVE
Glucose, UA: NEGATIVE
Ketones, UA: NEGATIVE
Nitrite, UA: NEGATIVE
Protein, UA: NEGATIVE
Spec Grav, UA: 1.025
Urobilinogen, UA: 0.2
pH, UA: 7

## 2013-03-30 LAB — POCT UA - MICROSCOPIC ONLY
Casts, Ur, LPF, POC: NEGATIVE
Crystals, Ur, HPF, POC: NEGATIVE
Yeast, UA: NEGATIVE

## 2013-03-30 MED ORDER — ONDANSETRON HCL 4 MG PO TABS: 4.0000 mg | ORAL_TABLET | Freq: Three times a day (TID) | ORAL | Status: DC | PRN

## 2013-03-30 MED ORDER — AMPHETAMINE-DEXTROAMPHETAMINE 30 MG PO TABS: 15.0000 mg | ORAL_TABLET | Freq: Two times a day (BID) | ORAL | Status: DC

## 2013-03-30 MED ORDER — CLONAZEPAM 0.5 MG PO TABS: ORAL_TABLET | ORAL | Status: DC

## 2013-03-30 MED ORDER — HYDROCODONE-ACETAMINOPHEN 10-325 MG PO TABS: 1.0000 | ORAL_TABLET | Freq: Four times a day (QID) | ORAL | Status: DC | PRN

## 2013-03-30 NOTE — Progress Notes (Signed)
This chart was scribed for Jamie Sia, MD by Joaquin Music, ED Scribe. This patient was seen in room Room 9 and the patient's care was started at 2:29 PM  Subjective:    Patient ID: Jamie Simmons, female    DOB: [birth year], 22 y.o.   MRN: [id number]  HPI Jamie Simmons is a 22 y.o. female with a hx of nephrolithiasis who presents to the [facility] complaining of back pain onset 3 days. She is concerned of possible UTI or nephrolithiasis. Pt states her pain is different compared to when she had nephrolithiasis a year ago (2013). Pt states she had to pass her last stones. Pt denies hematuria and burning with urination. Pt states she has had lower abd pain. Pt denies possible injury. 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 Pt denies nausea, emesis, diarrhea, and fever.  Pt is in her senior year at [employer]. Doing well with current medical regimen for attention deficit disorder  2:54 PM- Discussed labs with pt. Pt states her current pain is a 6/10. Pt declines having injection of pain medication while at [facility]. Pt states it took her 1.5 weeks to pass her stone. Pt declines lithotripsy tx at this point. Pt would rather pass the kidney stone present. Pt states she took hydrocodone that made her slightly sick but also took Zofran. Pt states she has been drinking more water than usual.  Pt states she would like a refill of her medications. Pt takes Klonopin and Adderall as directed. She states she loves her current job. She is uncertain of what to do upon conclusion of school.   Patient Active Problem List   Diagnosis Date Noted  . Dysmenorrhea 12/28/2011  . ADD (attention deficit disorder) 09/29/2011   Triage Vitals:BP 116/78  Pulse 98  Temp(Src) 98.7 F (37.1 C) (Oral)  Resp 20  Ht 5\' 5"  (1.651 m)  Wt 178 lb (80.74 kg)  BMI 29.62 kg/m2  SpO2 100%  Current outpatient prescriptions:amphetamine-dextroamphetamine (ADDERALL) 30 MG tablet, Take  0.5 tablets (15 mg total) by mouth 2 (two) times daily., Disp: 30 tablet, Rfl: 0;  amphetamine-dextroamphetamine (ADDERALL) 30 MG tablet, Take 0.5 tablets (15 mg total) by mouth 2 (two) times daily. May fill on/after 01/29/2013., Disp: 30 tablet, Rfl: 0 amphetamine-dextroamphetamine (ADDERALL) 30 MG tablet, Take 0.5 tablets (15 mg total) by mouth 2 (two) times daily. May fill on/after 02/28/2013., Disp: 30 tablet, Rfl: 0;  clonazePAM (KLONOPIN) 0.5 MG tablet, TAKE 1/2 TO 1 TABLET BY MOUTH AT BEDTIME AS NEEDED, Disp: 30 tablet, Rfl: 0;  medroxyPROGESTERone (DEPO-PROVERA) 150 MG/ML injection, Inject 150 mg into the muscle every 3 (three) months., Disp: , Rfl:  predniSONE (DELTASONE) 20 MG tablet, Take 3 PO QAM x3days, 2 PO QAM x3days, 1 PO QAM x3days, Disp: 18 tablet, Rfl: 0   Review of Systems  Constitutional: Negative for fever and chills.  Gastrointestinal: Negative for vomiting and diarrhea.  Genitourinary: Negative for dysuria and urgency.  Musculoskeletal: Positive for back pain.       Objective:   Physical Exam  Nursing note and vitals reviewed. Constitutional: She is oriented to person, place, and time. She appears well-developed and well-nourished. No distress.  HENT:  Head: Normocephalic and atraumatic.  Right Ear: External ear normal.  Left Ear: External ear normal.  Eyes: Conjunctivae are normal. Pupils are equal, round, and reactive to light.  Neck: Normal range of motion. Neck supple. No thyromegaly present.  Cardiovascular: Normal rate and  regular rhythm.   No murmur heard. Pulmonary/Chest: Effort normal and breath sounds normal. She has no wheezes.  Abdominal: Bowel sounds are normal. She exhibits no mass. There is no tenderness. There is no rebound and no guarding.  Positive percussion tenderness R flank.  Musculoskeletal: Normal range of motion. She exhibits no tenderness.  Neurological: She is alert and oriented to person, place, and time. She has normal reflexes.   Skin: Skin is warm and dry. She is not diaphoretic.  Psychiatric: She has a normal mood and affect. Her behavior is normal.    Results for orders placed in visit on 03/30/13  POCT URINALYSIS DIPSTICK      Result Value Range   Color, UA yellow     Clarity, UA hazy     Glucose, UA neg     Bilirubin, UA neg     Ketones, UA neg     Spec Grav, UA 1.025     Blood, UA moderate     pH, UA 7.0     Protein, UA neg     Urobilinogen, UA 0.2     Nitrite, UA neg     Leukocytes, UA Trace    POCT UA - MICROSCOPIC ONLY      Result Value Range   WBC, Ur, HPF, POC 2-7     RBC, urine, microscopic 20-tntc     Bacteria, U Microscopic 1+     Mucus, UA small     Epithelial cells, urine per micros 0-1     Crystals, Ur, HPF, POC neg     Casts, Ur, LPF, POC neg     Yeast, UA neg           Assessment & Plan:  Back pain - Plan: POCT urinalysis dipstick, POCT UA - Microscopic Only  ADD (attention deficit disorder) - Plan: Continue same medications  Insomnia--- continue clonazepam when necessary/she is improving  Hematuria--- suggesting right nephrolithiasis  She prefers treatment and waiting to pass the stone rather than imaging at this time, and will followup in 48-72 hours if needed  Meds ordered this encounter  Medications  . amphetamine-dextroamphetamine (ADDERALL) 30 MG tablet    Sig: Take 0.5 tablets (15 mg total) by mouth 2 (two) times daily.    Dispense:  30 tablet    Refill:  0    Order Specific Question:  Supervising Provider    Answer:  Horace Lukas P [[id number]]  . amphetamine-dextroamphetamine (ADDERALL) 30 MG tablet    Sig: Take 0.5 tablets (15 mg total) by mouth 2 (two) times daily. May fill on/after 01/29/2013.    Dispense:  30 tablet    Refill:  0    Order Specific Question:  Supervising Provider    Answer:  Izac Faulkenberry P [[id number]]  . amphetamine-dextroamphetamine (ADDERALL) 30 MG tablet    Sig: Take 0.5 tablets (15 mg total) by mouth 2 (two) times daily. May fill  on/after 02/28/2013.    Dispense:  30 tablet    Refill:  0    Order Specific Question:  Supervising Provider    Answer:  Colten Desroches P [[id number]]  . clonazePAM (KLONOPIN) 0.5 MG tablet    Sig: TAKE 1/2 TO 1 TABLET BY MOUTH AT BEDTIME AS NEEDED    Dispense:  30 tablet    Refill:  5  . HYDROcodone-acetaminophen (NORCO) 10-325 MG per tablet    Sig: Take 1 tablet by mouth every 6 (six) hours as needed for pain.    Dispense:  30  tablet    Refill:  0  . ondansetron (ZOFRAN) 4 MG tablet    Sig: Take 1 tablet (4 mg total) by mouth every 8 (eight) hours as needed for nausea.    Dispense:  30 tablet    Refill:  0   Return 3- 6mos

## 2013-06-12 ENCOUNTER — Ambulatory Visit (INDEPENDENT_AMBULATORY_CARE_PROVIDER_SITE_OTHER): Payer: 59

## 2013-06-12 DIAGNOSIS — Z309 Encounter for contraceptive management, unspecified: Secondary | ICD-10-CM

## 2013-06-12 MED ORDER — MEDROXYPROGESTERONE ACETATE 150 MG/ML IM SUSP
150.0000 mg | Freq: Once | INTRAMUSCULAR | Status: AC
  Administered 2013-06-12: 150 mg via INTRAMUSCULAR

## 2013-06-28 ENCOUNTER — Telehealth: Payer: Self-pay

## 2013-06-28 DIAGNOSIS — F988 Other specified behavioral and emotional disorders with onset usually occurring in childhood and adolescence: Secondary | ICD-10-CM

## 2013-06-28 MED ORDER — AMPHETAMINE-DEXTROAMPHETAMINE 30 MG PO TABS: 15.0000 mg | ORAL_TABLET | Freq: Two times a day (BID) | ORAL | Status: DC

## 2013-06-28 NOTE — Telephone Encounter (Signed)
Ready to pick up Dr Merla Richesoolittle - I wrote a month Rx for the patient for you.  While you are in the office will you write the other 2 months for the patient to hold onto.

## 2013-06-28 NOTE — Telephone Encounter (Signed)
Ok toref 3 mos// f/u 3mos

## 2013-06-28 NOTE — Telephone Encounter (Signed)
Patient is requesting refill on her adderall [phone]

## 2013-06-28 NOTE — Telephone Encounter (Signed)
Pt stated OK to just get 1 mos today and her mother may p/up when she comes in today. Dr Merla Richesoolittle, do you want to write the other two?

## 2013-06-29 MED ORDER — AMPHETAMINE-DEXTROAMPHETAMINE 30 MG PO TABS: 15.0000 mg | ORAL_TABLET | Freq: Two times a day (BID) | ORAL | Status: DC

## 2013-06-29 NOTE — Telephone Encounter (Signed)
rx in drawer. No ans/VM full.

## 2013-07-02 NOTE — Telephone Encounter (Signed)
Notified pt ready. 

## 2013-07-10 IMAGING — CR DG CHEST 2V
2 series · 2 of 2 positions shown · non-contrast
Comparison: 05/08/2008

CLINICAL DATA: Chest pain for several hours

CHEST - 2 VIEW

[w chest pa]
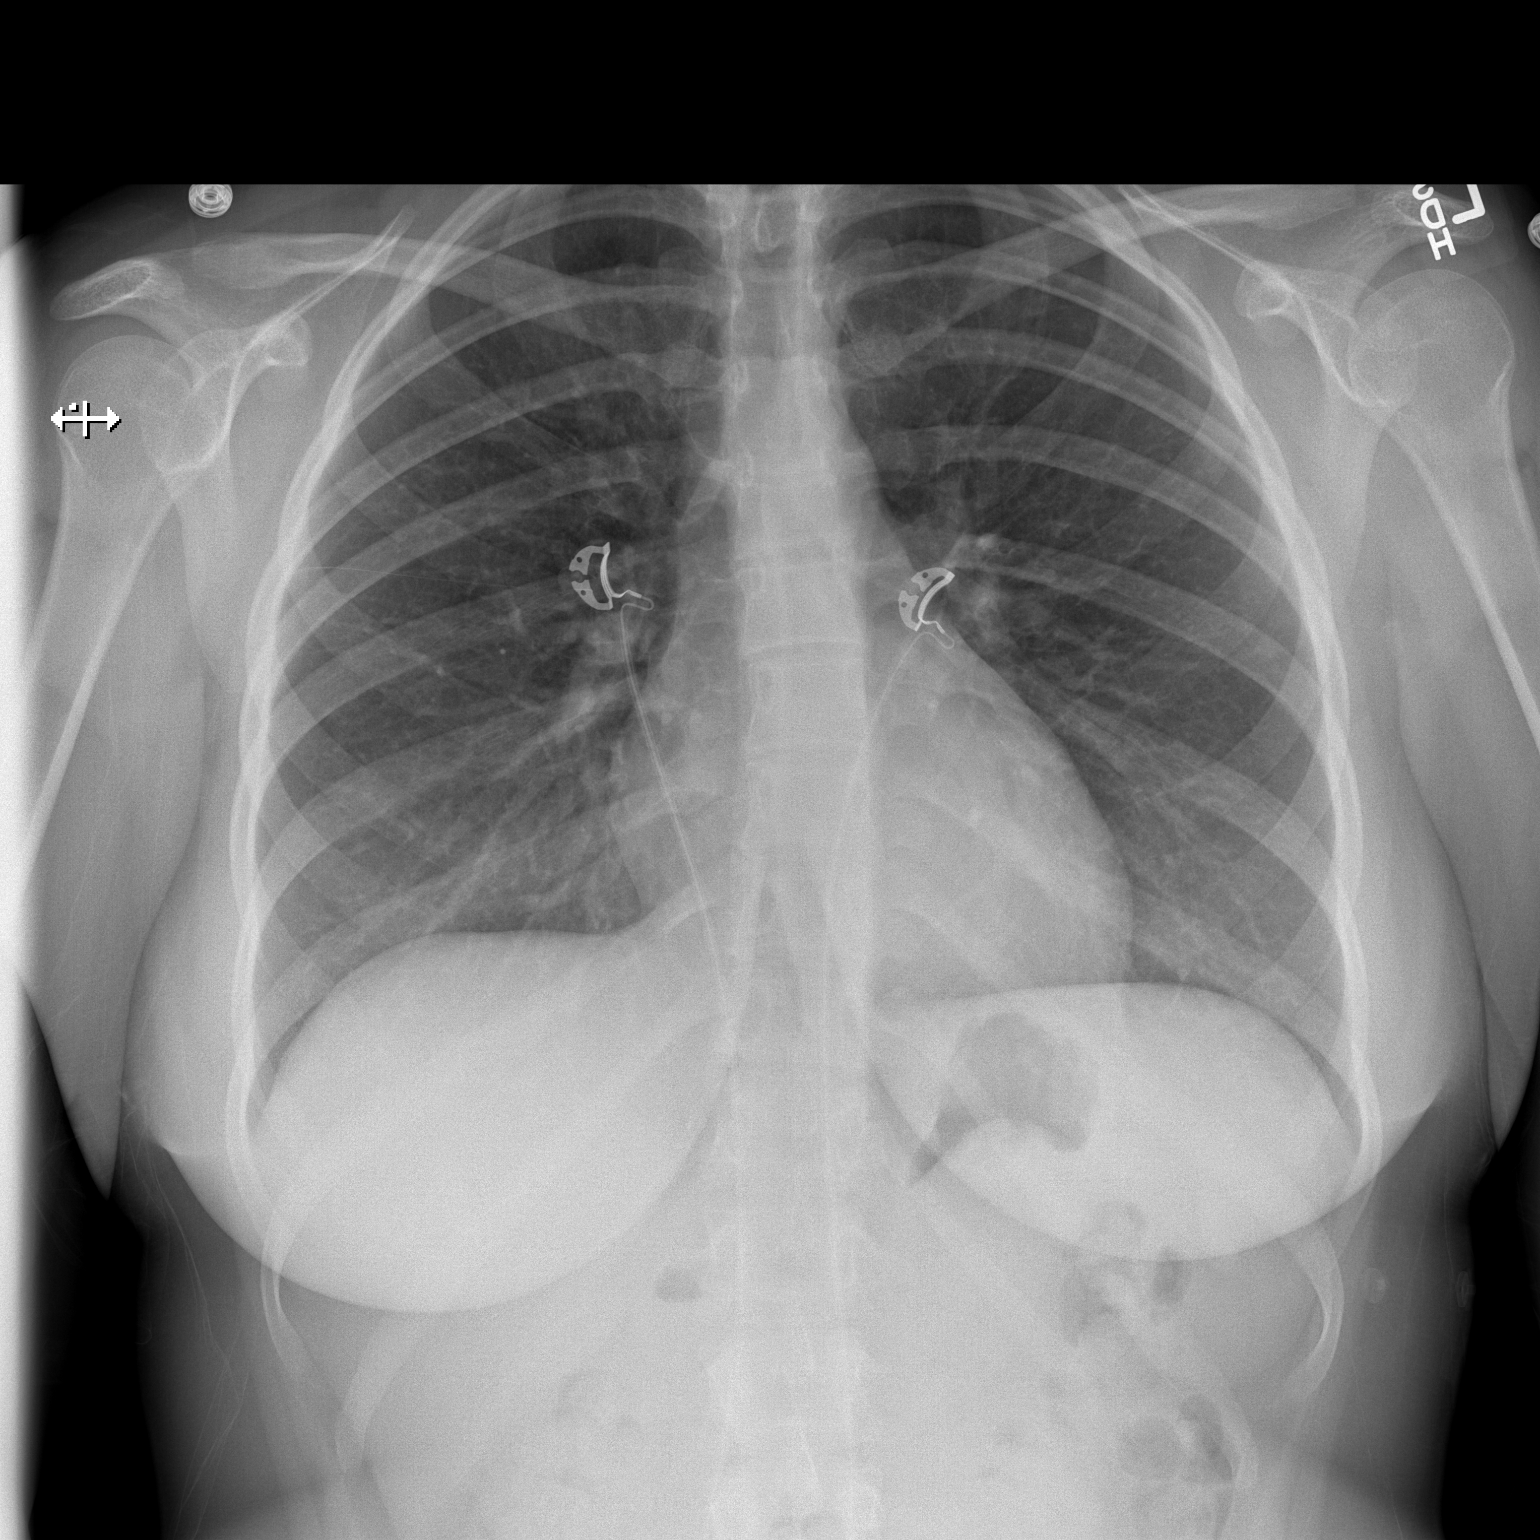

[w chest lat]
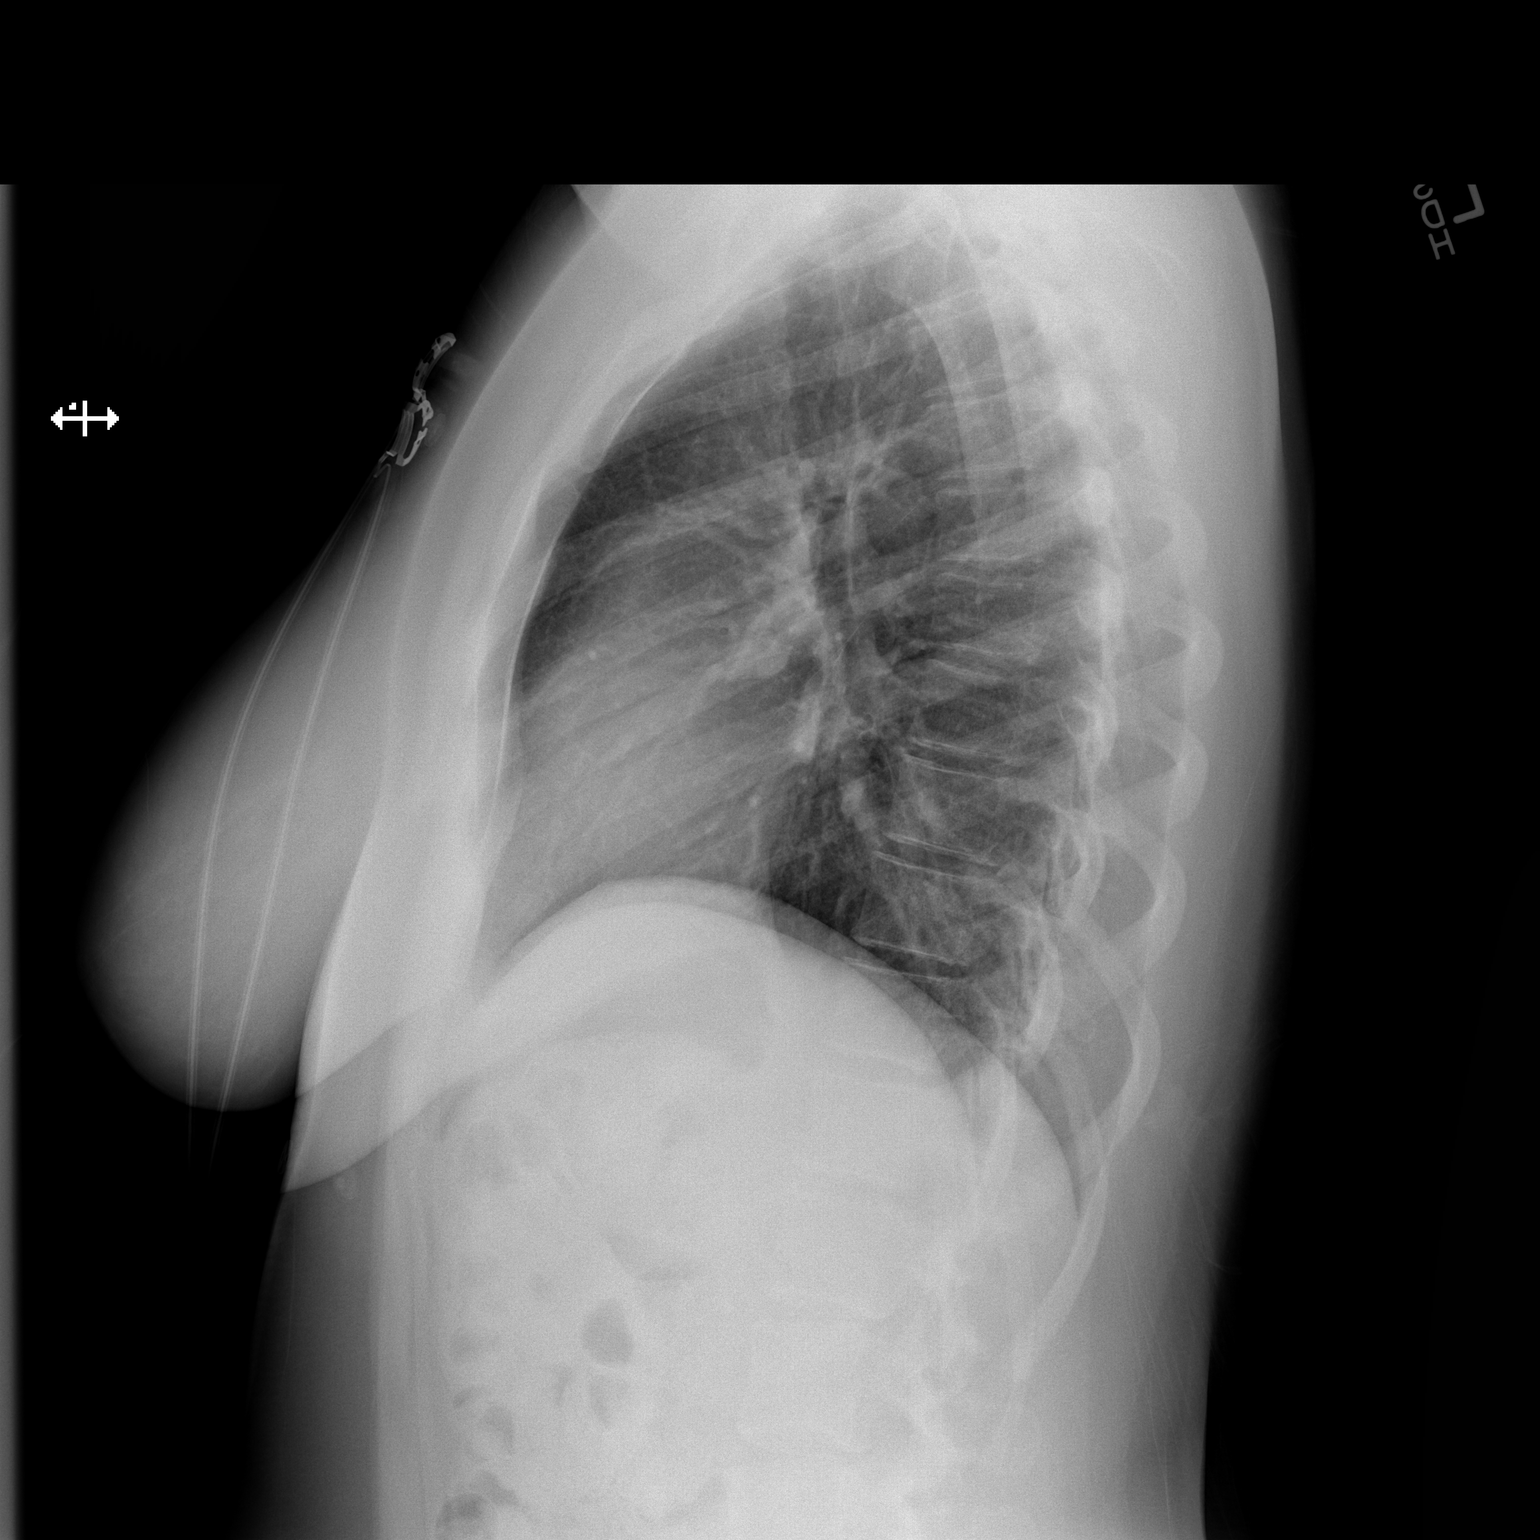

[2 of 2 positions shown; findings below may reference images not displayed]

FINDINGS: Normal heart size and pulmonary vascularity.  No focal
airspace consolidation in the lungs.  No blunting of costophrenic
angles.  No pneumothorax.  The no significant changes since the
previous study.
IMPRESSION: No evidence of active pulmonary disease.

## 2013-09-11 ENCOUNTER — Ambulatory Visit (INDEPENDENT_AMBULATORY_CARE_PROVIDER_SITE_OTHER): Payer: 59 | Admitting: Radiology

## 2013-09-11 DIAGNOSIS — Z309 Encounter for contraceptive management, unspecified: Secondary | ICD-10-CM

## 2013-09-11 MED ORDER — MEDROXYPROGESTERONE ACETATE 150 MG/ML IM SUSP
150.0000 mg | Freq: Once | INTRAMUSCULAR | Status: AC
  Administered 2013-09-11: 150 mg via INTRAMUSCULAR

## 2013-09-29 ENCOUNTER — Ambulatory Visit (INDEPENDENT_AMBULATORY_CARE_PROVIDER_SITE_OTHER): Payer: 59 | Admitting: Physician Assistant

## 2013-09-29 VITALS — BP 114/72 | HR 98 | Temp 98.6°F | Resp 18 | Ht 65.0 in | Wt 185.0 lb

## 2013-09-29 DIAGNOSIS — F988 Other specified behavioral and emotional disorders with onset usually occurring in childhood and adolescence: Secondary | ICD-10-CM

## 2013-09-29 MED ORDER — AMPHETAMINE-DEXTROAMPHETAMINE 20 MG PO TABS: 20.0000 mg | ORAL_TABLET | Freq: Three times a day (TID) | ORAL | Status: DC

## 2013-09-29 NOTE — Patient Instructions (Signed)
Let me know how this is working in about 10 days.  We can adjust the dose or make a change to the XR formulation.

## 2013-09-29 NOTE — Progress Notes (Signed)
Subjective:    Patient ID: Jamie Simmons, female    DOB: 1991/02/09, 23 y.o.   MRN: [id number]   PCP: No PCP Per Patient  Chief Complaint  Patient presents with  . Medication Problem    adderall changing dosage or switching to xr    Medications, allergies, past medical history, surgical history, family history, social history and problem list reviewed and updated.  Prior to Admission medications   Medication Sig Start Date End Date Taking? Authorizing Provider  amphetamine-dextroamphetamine (ADDERALL) 30 MG tablet Take 0.5 tablets (15 mg total) by mouth 2 (two) times daily. 06/28/13  Yes Morrell RiddleSarah L Weber, PA-C  amphetamine-dextroamphetamine (ADDERALL) 30 MG tablet Take 0.5 tablets (15 mg total) by mouth 2 (two) times daily. May fill on/after 08/26/13 06/29/13  Yes Tonye Pearsonobert P Doolittle, MD  amphetamine-dextroamphetamine (ADDERALL) 30 MG tablet Take 0.5 tablets (15 mg total) by mouth 2 (two) times daily. May fill on/after 07/29/13 06/29/13  Yes Tonye Pearsonobert P Doolittle, MD  clonazePAM (KLONOPIN) 0.5 MG tablet TAKE 1/2 TO 1 TABLET BY MOUTH AT BEDTIME AS NEEDED 03/30/13  Yes Tonye Pearsonobert P Doolittle, MD  medroxyPROGESTERone (DEPO-PROVERA) 150 MG/ML injection Inject 150 mg into the muscle every 3 (three) months.   Yes Historical Provider, MD  ondansetron (ZOFRAN) 4 MG tablet Take 1 tablet (4 mg total) by mouth every 8 (eight) hours as needed for nausea. 03/30/13  Yes Tonye Pearsonobert P Doolittle, MD     HPI Thinks she needs to change the Adderall dose.  She's noted for some time that it doesn't last as long as it did when she first started.  No she says that she can't even tell that she takes it.  She'd like to switch to the XR formulation.  With school and work, she's dealing with 12-hour days.  15 mg of adderall BID isn't working with the increased load.  Lots more inattention, distractibility, loss of motivation to get things done.  Review of Systems     Objective:   Physical Exam  Vitals  reviewed. Constitutional: She is oriented to person, place, and time. Vital signs are normal. She appears well-developed and well-nourished. She is active and cooperative. No distress.  BP 114/72  Pulse 98  Temp(Src) 98.6 F (37 C)  Resp 18  Ht 5\' 5"  (1.651 m)  Wt 185 lb (83.915 kg)  BMI 30.79 kg/m2  SpO2 98%  HENT:  Head: Normocephalic and atraumatic.  Right Ear: Hearing normal.  Left Ear: Hearing normal.  Eyes: Conjunctivae are normal. No scleral icterus.  Neck: Normal range of motion. Neck supple. No thyromegaly present.  Cardiovascular: Normal rate, regular rhythm and normal heart sounds.   Pulses:      Radial pulses are 2+ on the right side, and 2+ on the left side.  Pulmonary/Chest: Effort normal and breath sounds normal.  Lymphadenopathy:       Head (right side): No tonsillar, no preauricular, no posterior auricular and no occipital adenopathy present.       Head (left side): No tonsillar, no preauricular, no posterior auricular and no occipital adenopathy present.    She has no cervical adenopathy.       Right: No supraclavicular adenopathy present.       Left: No supraclavicular adenopathy present.  Neurological: She is alert and oriented to person, place, and time. No sensory deficit.  Skin: Skin is warm, dry and intact. No rash noted. No cyanosis or erythema. Nails show no clubbing.  Psychiatric: She has a normal mood and  affect.          Assessment & Plan:  1. ADD (attention deficit disorder) First, find dose that is effective, then plan change to XR formulation if she chooses, though she may find that she likes the flexibility of the IR.  Contact me by phone or message in 10-14 days with update. - amphetamine-dextroamphetamine (ADDERALL) 20 MG tablet; Take 1 tablet (20 mg total) by mouth 3 (three) times daily.  Dispense: 90 tablet; Refill: 0   Fernande Brashelle S. Trendon Zaring, PA-C Physician Assistant-Certified [facility]

## 2013-10-02 ENCOUNTER — Telehealth: Payer: Self-pay | Admitting: *Deleted

## 2013-10-02 ENCOUNTER — Telehealth: Payer: Self-pay

## 2013-10-02 MED ORDER — CLONAZEPAM 0.5 MG PO TABS: ORAL_TABLET | ORAL | Status: DC

## 2013-10-02 NOTE — Telephone Encounter (Signed)
Patient mother called stated she want to leave a message for Jamie Simmons. Mother is concerned about daughter's medication. [phone]

## 2013-10-02 NOTE — Telephone Encounter (Signed)
Chelle- Would you please call in Stevie's klonopin ? [facility] techs are being ridiculous . Thank you- Amy. (Her dob [birth year])

## 2013-10-02 NOTE — Telephone Encounter (Signed)
Meds ordered this encounter  Medications  . clonazePAM (KLONOPIN) 0.5 MG tablet    Sig: TAKE 1/2 TO 1 TABLET BY MOUTH AT BEDTIME AS NEEDED    Dispense:  30 tablet    Refill:  0    Order Specific Question:  Supervising Provider    Answer:  DOOLITTLE, ROBERT P [[id number]]   Printed.  Please phone in.

## 2013-10-02 NOTE — Telephone Encounter (Signed)
Verified w/mother that Rx needs to go to [facility]. Faxed.

## 2013-10-03 ENCOUNTER — Other Ambulatory Visit: Payer: Self-pay | Admitting: Internal Medicine

## 2013-10-03 NOTE — Telephone Encounter (Signed)
LMVM to CB. 

## 2013-10-04 NOTE — Telephone Encounter (Signed)
LMVM to CB. 

## 2013-10-05 NOTE — Telephone Encounter (Signed)
LMVM to CB. 

## 2013-10-26 ENCOUNTER — Telehealth: Payer: Self-pay

## 2013-10-26 DIAGNOSIS — F988 Other specified behavioral and emotional disorders with onset usually occurring in childhood and adolescence: Secondary | ICD-10-CM

## 2013-10-26 NOTE — Telephone Encounter (Signed)
Refill  amphetamine-dextroamphetamine (ADDERALL) 20 MG tablet  [phone]

## 2013-10-28 NOTE — Telephone Encounter (Signed)
Pt called again about the adderall refill.  She will be out before Chelle gets back on Tuesday.  Can anyone else refill?  [phone]

## 2013-10-29 NOTE — Telephone Encounter (Signed)
Can anyone else fill this for pt?

## 2013-10-29 NOTE — Telephone Encounter (Signed)
Sorry, I am now seeing this message after the doors have been locked for an hour. Per [facility] policy the prescriber is to write the medication unless prior arrangements have been clearly made otherwise the patient will have to come in to be seen. This is best for both the provider and patient.

## 2013-10-30 MED ORDER — AMPHETAMINE-DEXTROAMPHETAMINE 20 MG PO TABS: 20.0000 mg | ORAL_TABLET | Freq: Three times a day (TID) | ORAL | Status: DC

## 2013-10-30 NOTE — Telephone Encounter (Signed)
Rx printed at 104. I'll bring it to 102 after clinic.  Meds ordered this encounter  Medications  . amphetamine-dextroamphetamine (ADDERALL) 20 MG tablet    Sig: Take 1 tablet (20 mg total) by mouth 3 (three) times daily.    Dispense:  90 tablet    Refill:  0    Order Specific Question:  Supervising Provider    Answer:  DOOLITTLE, ROBERT P [[id number]]

## 2013-10-30 NOTE — Telephone Encounter (Signed)
[facility] Rx ready for p/up.

## 2013-11-01 ENCOUNTER — Other Ambulatory Visit: Payer: Self-pay | Admitting: Physician Assistant

## 2013-11-02 NOTE — Telephone Encounter (Signed)
Patient called to get prescription refill for Klonopoin. She put in a request from her pharmacy yesterday. They told her to give our office a call to check status.   Best: [phone]

## 2013-11-03 ENCOUNTER — Telehealth: Payer: Self-pay

## 2013-11-03 NOTE — Telephone Encounter (Signed)
NEEDS REFILL OF Jamie Simmons

## 2013-11-04 NOTE — Telephone Encounter (Signed)
Pt notified that rx sent to pharmacy

## 2013-11-04 NOTE — Telephone Encounter (Signed)
PATIENT IS CALLING TO SEE WHAT IS GOING ON WITH HER KLONOPIN REFILL

## 2013-11-06 NOTE — Telephone Encounter (Signed)
I couldn't tell if this had already been faxed, so I called it in and [facility] for pt to notify.

## 2013-11-27 ENCOUNTER — Telehealth: Payer: Self-pay

## 2013-11-27 DIAGNOSIS — F988 Other specified behavioral and emotional disorders with onset usually occurring in childhood and adolescence: Secondary | ICD-10-CM

## 2013-11-27 NOTE — Telephone Encounter (Signed)
PT IN NEED OF HER ADDERALL 20MG S. PLEASE CALL [phone]

## 2013-11-28 MED ORDER — AMPHETAMINE-DEXTROAMPHETAMINE 20 MG PO TABS: 20.0000 mg | ORAL_TABLET | Freq: Three times a day (TID) | ORAL | Status: DC

## 2013-11-28 NOTE — Telephone Encounter (Signed)
Pt called in regards to prescription, Advised her that it is being worked on, and she would receive a call when ready for pick up.

## 2013-11-28 NOTE — Telephone Encounter (Signed)
Notified pt rx ready 

## 2013-11-28 NOTE — Telephone Encounter (Signed)
Rx printed.  Meds ordered this encounter  Medications  . amphetamine-dextroamphetamine (ADDERALL) 20 MG tablet    Sig: Take 1 tablet (20 mg total) by mouth 3 (three) times daily.    Dispense:  90 tablet    Refill:  0    Order Specific Question:  Supervising Provider    Answer:  DOOLITTLE, ROBERT P [[id number]]

## 2013-12-03 ENCOUNTER — Other Ambulatory Visit: Payer: Self-pay | Admitting: Physician Assistant

## 2013-12-04 NOTE — Telephone Encounter (Signed)
AMY CALLED TODAY TO MAKE SURE CHELLE RECEIVED THE MESSAGE [facility]ARDING [facility] [phone]

## 2013-12-05 ENCOUNTER — Telehealth: Payer: Self-pay

## 2013-12-05 NOTE — Telephone Encounter (Signed)
Pt called for status on her clonazePAM (KLONOPIN) 0.5 MG tablet [[id number]][[id number]], said prescription was sent in on Monday. Please advise

## 2013-12-05 NOTE — Telephone Encounter (Signed)
Pt called requiring about this prescription, advised her that it was in process, and to allow 24-48 hours for prescription requests, and she would a receive a call when completed

## 2013-12-06 ENCOUNTER — Other Ambulatory Visit: Payer: Self-pay | Admitting: Physician Assistant

## 2013-12-06 NOTE — Telephone Encounter (Signed)
Mother called back from pharm and they still do not have the Rx. Chelle gave VO to call Rx into pharmacy. Advised mother that I would call and speak to pharmacist himself and let him know she is there waiting for Rx. Done.

## 2013-12-06 NOTE — Telephone Encounter (Signed)
Patient's mother calling to check the status of this medication refill.

## 2013-12-21 ENCOUNTER — Telehealth: Payer: Self-pay

## 2013-12-21 DIAGNOSIS — F988 Other specified behavioral and emotional disorders with onset usually occurring in childhood and adolescence: Secondary | ICD-10-CM

## 2013-12-21 NOTE — Telephone Encounter (Signed)
PATIENT IS CALLING FOR Jamie Simmons. SHE IS CALLING IN ADVANCE TO GET A REFILL FOR ADDERAL

## 2013-12-22 NOTE — Telephone Encounter (Signed)
Left message on machine to call back on home number to inquire as to why she needs an early RF--VM not set up on cell

## 2013-12-23 ENCOUNTER — Telehealth: Payer: Self-pay | Admitting: General Practice

## 2013-12-23 NOTE — Telephone Encounter (Signed)
See other msg

## 2013-12-23 NOTE — Telephone Encounter (Signed)
Pt is returning our call 

## 2013-12-23 NOTE — Telephone Encounter (Signed)
Pt just wants a post dated rx of her adderall. She just wants to be able to get it filled when she needs it.

## 2013-12-24 MED ORDER — AMPHETAMINE-DEXTROAMPHETAMINE 20 MG PO TABS: 20.0000 mg | ORAL_TABLET | Freq: Three times a day (TID) | ORAL | Status: DC

## 2013-12-24 NOTE — Telephone Encounter (Signed)
LM to advise pt. 

## 2013-12-24 NOTE — Telephone Encounter (Signed)
Last prescription was 11/28/2013. Meds ordered this encounter  Medications  . amphetamine-dextroamphetamine (ADDERALL) 20 MG tablet    Sig: Take 1 tablet (20 mg total) by mouth 3 (three) times daily.    Dispense:  90 tablet    Refill:  0    Order Specific Question:  Supervising Provider    Answer:  DOOLITTLE, ROBERT P [[id number]]

## 2013-12-31 ENCOUNTER — Other Ambulatory Visit: Payer: Self-pay | Admitting: Physician Assistant

## 2014-01-02 NOTE — Telephone Encounter (Signed)
Faxed

## 2014-01-24 ENCOUNTER — Telehealth: Payer: Self-pay

## 2014-01-24 DIAGNOSIS — F988 Other specified behavioral and emotional disorders with onset usually occurring in childhood and adolescence: Secondary | ICD-10-CM

## 2014-01-24 MED ORDER — AMPHETAMINE-DEXTROAMPHETAMINE 20 MG PO TABS: 20.0000 mg | ORAL_TABLET | Freq: Three times a day (TID) | ORAL | Status: DC

## 2014-01-24 NOTE — Telephone Encounter (Signed)
Request refill amphetamine-dextroamphetamine (ADDERALL) 20 MG tablet   [phone] (H)  Okay to leave a message.

## 2014-01-24 NOTE — Telephone Encounter (Signed)
Rx's printed.  Will need OV for the next set.  Meds ordered this encounter  Medications  . amphetamine-dextroamphetamine (ADDERALL) 20 MG tablet    Sig: Take 1 tablet (20 mg total) by mouth 3 (three) times daily.    Dispense:  90 tablet    Refill:  0    Order Specific Question:  Supervising Provider    Answer:  DOOLITTLE, ROBERT P [[id number]]  . amphetamine-dextroamphetamine (ADDERALL) 20 MG tablet    Sig: Take 1 tablet (20 mg total) by mouth 3 (three) times daily. May fill 30 days after date on prescription.    Dispense:  90 tablet    Refill:  0    Order Specific Question:  Supervising Provider    Answer:  DOOLITTLE, ROBERT P [[id number]]  . amphetamine-dextroamphetamine (ADDERALL) 20 MG tablet    Sig: Take 1 tablet (20 mg total) by mouth 3 (three) times daily. May fill 60 days after date on prescription.    Dispense:  90 tablet    Refill:  0    Order Specific Question:  Supervising Provider    Answer:  DOOLITTLE, ROBERT P [[id number]]

## 2014-01-25 NOTE — Telephone Encounter (Signed)
Advised pt needs OV next time- she states she will come to Urgent Care.  Pt will pick up Rx later today.

## 2014-02-27 ENCOUNTER — Ambulatory Visit (INDEPENDENT_AMBULATORY_CARE_PROVIDER_SITE_OTHER): Payer: 59 | Admitting: Physician Assistant

## 2014-02-27 VITALS — BP 120/80 | HR 113 | Temp 99.4°F | Resp 16 | Ht 66.0 in | Wt 194.0 lb

## 2014-02-27 DIAGNOSIS — N946 Dysmenorrhea, unspecified: Secondary | ICD-10-CM

## 2014-02-27 DIAGNOSIS — F988 Other specified behavioral and emotional disorders with onset usually occurring in childhood and adolescence: Secondary | ICD-10-CM

## 2014-02-27 DIAGNOSIS — Z309 Encounter for contraceptive management, unspecified: Secondary | ICD-10-CM

## 2014-02-27 MED ORDER — AMPHETAMINE-DEXTROAMPHET ER 30 MG PO CP24: 60.0000 mg | ORAL_CAPSULE | ORAL | Status: DC

## 2014-02-27 MED ORDER — AMPHETAMINE-DEXTROAMPHETAMINE 20 MG PO TABS: 20.0000 mg | ORAL_TABLET | Freq: Every day | ORAL | Status: DC

## 2014-02-27 MED ORDER — MEDROXYPROGESTERONE ACETATE 150 MG/ML IM SUSP
150.0000 mg | Freq: Once | INTRAMUSCULAR | Status: AC
  Administered 2014-02-27: 150 mg via INTRAMUSCULAR

## 2014-02-27 NOTE — Patient Instructions (Signed)
Let me know how this new regimen is working in about 1-2 weeks.

## 2014-02-27 NOTE — Progress Notes (Signed)
   Subjective:    Patient ID: Jamie Simmons, female    DOB: Oct 20, 1990, 23 y.o.   MRN: [id number]   PCP: Madyn Ivins, PA-C  Chief Complaint  Patient presents with  . Medication Refill    adderall  . depo shot   Medications, allergies, past medical history, surgical history, family history, social history and problem list reviewed and updated.  Patient Active Problem List   Diagnosis Date Noted  . Dysmenorrhea 12/28/2011  . ADD (attention deficit disorder) 09/29/2011    Prior to Admission medications   Medication Sig Start Date End Date Taking? Authorizing Provider  amphetamine-dextroamphetamine (ADDERALL) 20 MG tablet Take 1 tablet (20 mg total) by mouth 3 (three) times daily. 01/24/14  Yes Joell Buerger S Taeler Winning, PA-C  clonazePAM (KLONOPIN) 0.5 MG tablet TAKE 1/2 TO 1 TABLET BY MOUTH AT BEDTIME AS NEEDED   Yes Keny Donald S Leesha Veno, PA-C  medroxyPROGESTERone (DEPO-PROVERA) 150 MG/ML injection Inject 150 mg into the muscle every 3 (three) months.   Yes Historical Provider, MD    HPI  This 23 y.o. female presents for evaluation of ADD and dysmenorrhea.  She's due for Depo Provera.  Tolerating it well, despite fear of shots.  It's worth it to her not to have such terrible periods.  ADD is less well controlled, but she hasn't wanted to come in to discuss it, so there have been no changes in her dosing. Current dose worked for several months. Effect wearing off before it's time to take the next dose, and doesn't seem to be as beneficial as this dose was initially. Remembering to take it regularly. Doesn't seem to cause insomnia, but does experience intermittent difficulty falling asleep. "I think sometimes I just get bored lying there." Once she falls asleep, she can stay asleep without difficulty.  Review of Systems     Objective:   Physical Exam  Constitutional: She is oriented to person, place, and time. She appears well-developed and well-nourished. She is active and  cooperative. No distress.  BP 120/80  Pulse 113  Temp(Src) 99.4 F (37.4 C) (Oral)  Resp 16  Ht  (1.676 m)  Wt 194 lb (87.998 kg)  BMI 31.33 kg/m2  SpO2 100%   Eyes: Conjunctivae are normal.  Pulmonary/Chest: Effort normal.  Neurological: She is alert and oriented to person, place, and time.  Psychiatric: She has a normal mood and affect. Her speech is normal and behavior is normal.          Assessment & Plan:  1. Dysmenorrhea Well controlled. - medroxyPROGESTERone (DEPO-PROVERA) injection 150 mg; Inject 1 mL (150 mg total) into the muscle once.  2. ADD (attention deficit disorder) Change from IR TID to QAM XR dose (converted current total daily IR dose to XR dose), and continue PRN IR dose in the afternoons as needed. Let me know in the next 1-2 weeks how it's working.  Plan to re-evaluate in 3 months, sooner if needed. - amphetamine-dextroamphetamine (ADDERALL) 20 MG tablet; Take 1 tablet (20 mg total) by mouth daily.  Dispense: 30 tablet; Refill: 0 - amphetamine-dextroamphetamine (ADDERALL XR) 30 MG 24 hr capsule; Take 2 capsules (60 mg total) by mouth every morning.  Dispense: 60 capsule; Refill: 0   Fernande Bras, PA-C Physician Assistant-Certified [facility]

## 2014-04-03 ENCOUNTER — Telehealth: Payer: Self-pay

## 2014-04-03 DIAGNOSIS — F988 Other specified behavioral and emotional disorders with onset usually occurring in childhood and adolescence: Secondary | ICD-10-CM

## 2014-04-03 NOTE — Telephone Encounter (Signed)
It is working very not well. She is getting HA's. Started when switched dosages. Hasn't had  time to come in to discuss this with you. Wants to know if it's common to get HA's from XR? Feels like XR is not working "might as well take nothing". Please advise. Thanks

## 2014-04-03 NOTE — Telephone Encounter (Signed)
Patient called and wants Chelle to refill her adderall. Patient states she needs to talk to her about the dosage of the medication. Please return call and advise. Cb # [phone]

## 2014-04-03 NOTE — Telephone Encounter (Signed)
Please get details. At her visit last month, we switched from immediate release TID to XR, with immediate release as needed in the afternoons. She was to let me know how that was working.

## 2014-04-04 NOTE — Telephone Encounter (Signed)
I'm not aware of HA associated with the switch from IR to XR.  HA is certainly a common adverse effect of both formulations.  Choices: 1. Go back to the immediate release, and increase the dose. 2. Increase the XR dose.

## 2014-04-04 NOTE — Telephone Encounter (Signed)
Left message on machine to call back on home number but unable to LM on cell-VM box full

## 2014-04-05 NOTE — Telephone Encounter (Signed)
Spoke to pt- she states she needs refill on these medications

## 2014-04-05 NOTE — Telephone Encounter (Signed)
LM for rtn call. 

## 2014-04-06 MED ORDER — AMPHETAMINE-DEXTROAMPHETAMINE 30 MG PO TABS: 30.0000 mg | ORAL_TABLET | Freq: Three times a day (TID) | ORAL | Status: DC

## 2014-04-06 NOTE — Telephone Encounter (Signed)
Please call this patient again. I need to know how she'd like to proceed.  See my message 10/29.

## 2014-04-06 NOTE — Telephone Encounter (Signed)
Pt notified that this is ready for p/u 

## 2014-04-06 NOTE — Telephone Encounter (Signed)
Meds ordered this encounter  Medications  . amphetamine-dextroamphetamine (ADDERALL) 30 MG tablet    Sig: Take 1 tablet by mouth 3 (three) times daily.    Dispense:  90 tablet    Refill:  0    Order Specific Question:  Supervising Provider    Answer:  DOOLITTLE, ROBERT P [[id number]]    

## 2014-04-06 NOTE — Telephone Encounter (Signed)
Go back to the immediate release, and increase the dose.

## 2014-05-01 ENCOUNTER — Telehealth: Payer: Self-pay

## 2014-05-01 DIAGNOSIS — F988 Other specified behavioral and emotional disorders with onset usually occurring in childhood and adolescence: Secondary | ICD-10-CM

## 2014-05-01 MED ORDER — AMPHETAMINE-DEXTROAMPHETAMINE 30 MG PO TABS: 30.0000 mg | ORAL_TABLET | Freq: Three times a day (TID) | ORAL | Status: DC

## 2014-05-01 NOTE — Telephone Encounter (Signed)
Meds ordered this encounter  Medications  . amphetamine-dextroamphetamine (ADDERALL) 30 MG tablet    Sig: Take 1 tablet by mouth 3 (three) times daily.    Dispense:  90 tablet    Refill:  0    Order Specific Question:  Supervising Provider    Answer:  DOOLITTLE, ROBERT P [[id number]]    

## 2014-05-01 NOTE — Telephone Encounter (Signed)
Pt in need of her ADDERALL 30MG S. Please call [phone]

## 2014-05-03 NOTE — Telephone Encounter (Signed)
This has been p/up by pt already.

## 2014-05-20 ENCOUNTER — Ambulatory Visit (INDEPENDENT_AMBULATORY_CARE_PROVIDER_SITE_OTHER): Payer: 59 | Admitting: Family Medicine

## 2014-05-20 VITALS — BP 120/80 | HR 111 | Temp 99.0°F | Resp 18 | Ht 66.0 in | Wt 193.2 lb

## 2014-05-20 DIAGNOSIS — R519 Headache, unspecified: Secondary | ICD-10-CM

## 2014-05-20 DIAGNOSIS — G43009 Migraine without aura, not intractable, without status migrainosus: Secondary | ICD-10-CM

## 2014-05-20 DIAGNOSIS — R51 Headache: Secondary | ICD-10-CM

## 2014-05-20 MED ORDER — METHOCARBAMOL 500 MG PO TABS: 500.0000 mg | ORAL_TABLET | Freq: Every evening | ORAL | Status: DC | PRN

## 2014-05-20 MED ORDER — VENLAFAXINE HCL ER 37.5 MG PO CP24: 37.5000 mg | ORAL_CAPSULE | Freq: Every day | ORAL | Status: DC

## 2014-05-20 MED ORDER — VENLAFAXINE HCL 37.5 MG PO TABS: 37.5000 mg | ORAL_TABLET | Freq: Two times a day (BID) | ORAL | Status: DC

## 2014-05-20 MED ORDER — SUMATRIPTAN SUCCINATE 100 MG PO TABS: 100.0000 mg | ORAL_TABLET | ORAL | Status: DC | PRN

## 2014-05-20 MED ORDER — KETOROLAC TROMETHAMINE 60 MG/2ML IM SOLN
60.0000 mg | Freq: Once | INTRAMUSCULAR | Status: AC
  Administered 2014-05-20: 60 mg via INTRAMUSCULAR

## 2014-05-20 NOTE — Progress Notes (Signed)
Subjective:  This chart was scribed for Jamie SimmerKristi Nisaiah Bechtol, MD by Jamie Simmons, ED Scribe. This Patient was seen in room 09 and the patients care was started at 8:41 PM   Patient ID: Jamie DecStephanie E Simmons, female    DOB: 04/14/1991, 23 y.o.   MRN: [id number]  05/20/2014  Migraine; Dizziness; and Nausea   HPI HPI Comments: Jamie Simmons is a 23 y.o. female who [facility] the [facility] complaining of migraine HA onset 2 days ago. She reports Hx of migraines but states that she has been having them more constantly over the past 6 months. Pt states she has an headache almost everyday for the past 6 months. Pt states prior to 6 months ago she hasn't had a migraine HA since grade school. Pt states that her headaches feel as if they are tension headaches. She states that it starts in the back of her neck and radiates to the top of head. She states she  Has had associated nausea and dizziness. Pt states she does see visual auras when her headaches start.  She states that the severity of her headaches are 8/10 at there worse. She states the severity of her headache now is 4/10. States she has taken Advil migraine with no relief. Pt states she thinks they are related to her vision and thinks she may need some glasses. Denies any recent stressors in her life. States she has recently stopped drinking soda. She states she stopped drinking caffeine but states she has started back due to thinking that it may have been related to her headaches. She denies sleep disturbance. Denies fevers. Denies vomiting, sensitivity to sounds, photophobia, numbness, weakness, blurred vision or double vision. Pt states that her mother has a Hx of migraine headaches.  Admits to only sleeping five hours per night but this has been going on for years.  Loves her job and has been promoted quickly; pt admits that she is addicted to her work. She left work early today due to migraine but otherwise has not missed work due  to headaches.  Rarely ever comes in for evaluation but is finally fed up.     Has Klonopin for PRN use for intermittent anxiety; denies regular anxiety but does admit that she is tightly wound.    Review of Systems  Constitutional: Negative for fever, chills, diaphoresis, activity change, appetite change and fatigue.  HENT: Negative for congestion, ear pain, rhinorrhea and sore throat.   Eyes: Positive for visual disturbance. Negative for photophobia, pain and redness.  Gastrointestinal: Positive for nausea. Negative for vomiting.  Skin: Negative for rash.  Neurological: Positive for dizziness and headaches. Negative for tremors, seizures, syncope, facial asymmetry, speech difficulty, weakness, light-headedness and numbness.  Psychiatric/Behavioral: Negative for sleep disturbance and dysphoric mood. The patient is not nervous/anxious.     Past Medical History  Diagnosis Date  . Attention deficit disorder   . Nephrolithiasis 02/2012  . Migraine    History reviewed. No pertinent past surgical history. No Known Allergies      Objective:    BP 120/80 mmHg  Pulse 111  Temp(Src) 99 F (37.2 C) (Oral)  Resp 18  Ht 5\' 6"  (1.676 m)  Wt 193 lb 3.2 oz (87.635 kg)  BMI 31.20 kg/m2  SpO2 100%   Physical Exam  Constitutional: She is oriented to person, place, and time. She appears well-developed and well-nourished. No distress.  HENT:  Head: Normocephalic and atraumatic.  Right Ear: External ear normal.  Left Ear: External ear normal.  Nose: Nose normal.  Mouth/Throat: Oropharynx is clear and moist.  Eyes: Conjunctivae and EOM are normal. Pupils are equal, round, and reactive to light.  Neck: Normal range of motion. Neck supple. Carotid bruit is not present. No tracheal deviation present. No thyromegaly present.  Cardiovascular: Normal rate, regular rhythm, normal heart sounds and intact distal pulses.  Exam reveals no gallop and no friction rub.   No murmur  heard. Pulmonary/Chest: Effort normal and breath sounds normal. No respiratory distress. She has no wheezes. She has no rales.  Abdominal: Soft. Bowel sounds are normal. She exhibits no distension and no mass. There is no tenderness. There is no rebound and no guarding.  Musculoskeletal: Normal range of motion.  Lymphadenopathy:    She has no cervical adenopathy.  Neurological: She is alert and oriented to person, place, and time. No cranial nerve deficit. She exhibits normal muscle tone. Coordination normal.  Skin: Skin is warm and dry. No rash noted. She is not diaphoretic. No erythema. No pallor.  Psychiatric: She has a normal mood and affect. Her behavior is normal. Judgment and thought content normal.  Nursing note and vitals reviewed.    TORADOL 60MG  IM ADMINISTERED.     Assessment & Plan:   1. Headache, unspecified headache type   2. Migraine without aura and without status migrainosus, not intractable     1. Migraine headaches:  Worsening in past six months.  S/p Toradol 60mg  IM in office.  Warrants preventative medication at this time; rx for Effexor ER 37.5mg  daily provided.  Rx for Imitrex and Robaxin provided for acute treatment of migraines.  Pt declined referral to headache specialist at this time.  Follow-up with PCP/Chelle Leotis ShamesJeffery, PA-C in one month for follow-up. Also recommend follow-up with ophthalmology for full eye exam.  If headaches persists without improvement at follow-up, consider MRI brain due to abrupt onset.  Normal neurological exam in office.  Question if demands of work/job and promotion is trigger.   Meds ordered this encounter  Medications  . DISCONTD: venlafaxine (EFFEXOR) 37.5 MG tablet    Sig: Take 1 tablet (37.5 mg total) by mouth 2 (two) times daily.    Dispense:  30 tablet    Refill:  2  . methocarbamol (ROBAXIN) 500 MG tablet    Sig: Take 1-2 tablets (500-1,000 mg total) by mouth at bedtime as needed for muscle spasms.    Dispense:  40 tablet     Refill:  1  . ketorolac (TORADOL) injection 60 mg    Sig:   . SUMAtriptan (IMITREX) 100 MG tablet    Sig: Take 1 tablet (100 mg total) by mouth every 2 (two) hours as needed for migraine or headache. May repeat in 2 hours if headache persists or recurs.    Dispense:  8 tablet    Refill:  3  . venlafaxine XR (EFFEXOR-XR) 37.5 MG 24 hr capsule    Sig: Take 1 capsule (37.5 mg total) by mouth daily with breakfast.    Dispense:  30 capsule    Refill:  2    No Follow-up on file.    I personally performed the services described in this documentation, which was scribed in my presence. The recorded information has been reviewed and considered.  Jamie SimmerKristi Benson Porcaro, M.D.  [facility]  [place] [address] [place], [place]  [id number] [phone] phone [phone] fax

## 2014-05-20 NOTE — Patient Instructions (Signed)

## 2014-05-28 ENCOUNTER — Telehealth: Payer: Self-pay

## 2014-05-28 DIAGNOSIS — F988 Other specified behavioral and emotional disorders with onset usually occurring in childhood and adolescence: Secondary | ICD-10-CM

## 2014-05-28 NOTE — Telephone Encounter (Signed)
CHELLE - Pt needs a refill on her adderall.  She knows the request is early, but she just wanted to get the ball rolling on it so she wouldn't run out.   [phone]

## 2014-05-29 MED ORDER — AMPHETAMINE-DEXTROAMPHETAMINE 30 MG PO TABS: 30.0000 mg | ORAL_TABLET | Freq: Three times a day (TID) | ORAL | Status: DC

## 2014-05-29 NOTE — Telephone Encounter (Signed)
Meds ordered this encounter  Medications  . amphetamine-dextroamphetamine (ADDERALL) 30 MG tablet    Sig: Take 1 tablet by mouth 3 (three) times daily.    Dispense:  90 tablet    Refill:  0    Order Specific Question:  Supervising Provider    Answer:  DOOLITTLE, ROBERT P [[id number]]    

## 2014-05-29 NOTE — Telephone Encounter (Signed)
Lm to advised pt Rx in pick up drawer.

## 2014-06-25 ENCOUNTER — Telehealth: Payer: Self-pay

## 2014-06-25 DIAGNOSIS — F988 Other specified behavioral and emotional disorders with onset usually occurring in childhood and adolescence: Secondary | ICD-10-CM

## 2014-06-25 NOTE — Telephone Encounter (Signed)
Jamie Simmons - Pt needs a refill on her adderall.  She is calling a few days early just to get the ball rolling on this.  [phone]

## 2014-06-30 MED ORDER — AMPHETAMINE-DEXTROAMPHETAMINE 30 MG PO TABS: 30.0000 mg | ORAL_TABLET | Freq: Three times a day (TID) | ORAL | Status: DC

## 2014-06-30 NOTE — Telephone Encounter (Signed)
Pt advised rx ready for pick up- rx in pick up drawer.

## 2014-06-30 NOTE — Telephone Encounter (Signed)
rx printed.  Meds ordered this encounter  Medications  . amphetamine-dextroamphetamine (ADDERALL) 30 MG tablet    Sig: Take 1 tablet by mouth 3 (three) times daily.    Dispense:  90 tablet    Refill:  0    Order Specific Question:  Supervising Provider    Answer:  DOOLITTLE, ROBERT P [[id number]]    

## 2014-07-25 ENCOUNTER — Other Ambulatory Visit: Payer: Self-pay

## 2014-07-25 DIAGNOSIS — F988 Other specified behavioral and emotional disorders with onset usually occurring in childhood and adolescence: Secondary | ICD-10-CM

## 2014-07-25 NOTE — Telephone Encounter (Signed)
Patient is requesting a refill on her Adderall states she has a couple days left. Please call when ready to be picked up at [phone]

## 2014-07-26 MED ORDER — AMPHETAMINE-DEXTROAMPHETAMINE 30 MG PO TABS: 30.0000 mg | ORAL_TABLET | Freq: Three times a day (TID) | ORAL | Status: DC

## 2014-07-26 NOTE — Telephone Encounter (Signed)
Notified pt ready. 

## 2014-08-21 ENCOUNTER — Other Ambulatory Visit: Payer: Self-pay

## 2014-08-21 DIAGNOSIS — F988 Other specified behavioral and emotional disorders with onset usually occurring in childhood and adolescence: Secondary | ICD-10-CM

## 2014-08-21 NOTE — Telephone Encounter (Signed)
Pt requesting refill on amphetamine-dextroamphetamine (ADDERALL) 30 MG tablet [[id number]][[id number]]

## 2014-08-22 MED ORDER — AMPHETAMINE-DEXTROAMPHETAMINE 30 MG PO TABS: 30.0000 mg | ORAL_TABLET | Freq: Three times a day (TID) | ORAL | Status: DC

## 2014-08-22 NOTE — Telephone Encounter (Signed)
Mother in [place] requesting to pick up rx for daughter's Adderall 30mg .  Rx provided to mother.

## 2014-09-17 ENCOUNTER — Ambulatory Visit (INDEPENDENT_AMBULATORY_CARE_PROVIDER_SITE_OTHER): Payer: 59 | Admitting: Physician Assistant

## 2014-09-17 VITALS — BP 130/78 | HR 102 | Temp 98.1°F | Resp 17 | Ht 66.5 in | Wt 197.4 lb

## 2014-09-17 DIAGNOSIS — M6248 Contracture of muscle, other site: Secondary | ICD-10-CM

## 2014-09-17 DIAGNOSIS — F909 Attention-deficit hyperactivity disorder, unspecified type: Secondary | ICD-10-CM

## 2014-09-17 DIAGNOSIS — R519 Headache, unspecified: Secondary | ICD-10-CM | POA: Insufficient documentation

## 2014-09-17 DIAGNOSIS — M62838 Other muscle spasm: Secondary | ICD-10-CM

## 2014-09-17 DIAGNOSIS — R51 Headache: Secondary | ICD-10-CM | POA: Diagnosis not present

## 2014-09-17 DIAGNOSIS — F988 Other specified behavioral and emotional disorders with onset usually occurring in childhood and adolescence: Secondary | ICD-10-CM

## 2014-09-17 MED ORDER — CYCLOBENZAPRINE HCL 5 MG PO TABS: 5.0000 mg | ORAL_TABLET | Freq: Three times a day (TID) | ORAL | Status: DC | PRN

## 2014-09-17 MED ORDER — PREDNISONE 10 MG PO TABS: 10.0000 mg | ORAL_TABLET | Freq: Every day | ORAL | Status: DC

## 2014-09-17 MED ORDER — AMPHETAMINE-DEXTROAMPHETAMINE 30 MG PO TABS: 30.0000 mg | ORAL_TABLET | Freq: Three times a day (TID) | ORAL | Status: DC

## 2014-09-17 NOTE — Progress Notes (Signed)
Subjective:    Patient ID: Jamie Simmons, female    DOB: 11-Feb-1991, 24 y.o.   MRN: [id number]  HPI Pt presents to clinic with continue headaches.  She has had a history of chronic daily headaches that are in the back of her neck and she blames them on tension.  She has had migraines in the past but they were rare until December 2015 when they started to get more frequent and last days at a time.  She also gets headaches related to dehydration and hunger but these are different and she can distinguish them.  Her daily headaches are in the back of her neck at the base of her skull and are dull.  She take Advil migraine  in the am and then at lunch every day - no matter what the intensity of her headache.  Then she develops this other type of headache which she calls a migraine that lasts from 2-4 days and nothing helps with its intensity. It is all over the head and she gets visual disturbances with these headaches - glittery like lights, shadows and wavy lines at times - these visual disturbances do not happen together but they can happen during 1 headache.  She does not get phonophobia or photophobia but will sometimes get nauseated and has smell sensitivity.  She never leaves work from the headaches because she never leaves work no matter what.  She does feel like these headaches affect her life in other ways with increase irritability and fatigue.  Sleep does not always help these headaches and she will sometimes wake up with them. She is not always able to tell when these headaches start.  She was evaluated 05/2014 and started on Effexor but she did not like the way it made her feel and stopped it after 2 weeks. The Imitrex she took once (along with a 5 hr energy) and it made her feel terrible and did not help the headache and does not plan on taking it again.  She takes the Robaxin at times at night (30 pills have lasted almost 4 months) but she is not able to take it at work because of  drowsiness and she really does not feel that it helps her headaches.  She was given a tordol injection She does not like taking medication.  She has had no change in her life/stress in the last 4 months.  She has not had an eye/vision evaluation.   Review of Systems  Eyes: Positive for visual disturbance (no focus problems or trouble seeing - she has known floaters). Negative for photophobia.  Gastrointestinal: Positive for nausea.  Neurological: Positive for headaches. Negative for dizziness.      Objective:   Physical Exam  Constitutional: She is oriented to person, place, and time. She appears well-developed and well-nourished.  BP 130/78 mmHg  Pulse 102  Temp(Src) 98.1 F (36.7 C) (Oral)  Resp 17  Ht 5' 6.5" (1.689 m)  Wt 197 lb 6.4 oz (89.54 kg)  BMI 31.39 kg/m2  SpO2 97%   HENT:  Head: Normocephalic and atraumatic.  Right Ear: External ear normal.  Left Ear: External ear normal.  Eyes: Conjunctivae and EOM are normal. Pupils are equal, round, and reactive to light.  Neck: Normal range of motion and full passive range of motion without pain. Neck supple. Muscular tenderness present.    Cardiovascular: Normal rate, regular rhythm and normal heart sounds.   No murmur heard. Pulmonary/Chest: Effort normal and breath sounds normal.  She has no wheezes.  Musculoskeletal: Normal range of motion.  Neurological: She is alert and oriented to person, place, and time. She has normal reflexes.  Skin: Skin is warm and dry.  Psychiatric: She has a normal mood and affect. Her behavior is normal. Judgment and thought content normal.       Assessment & Plan:  ADD (attention deficit disorder) - Pt wanted her medication Rx today - she knows it is a few days early and it was dated for the day it was due.  Plan: amphetamine-dextroamphetamine (ADDERALL) 30 MG tablet  Headache, chronic daily - Plan: predniSONE (DELTASONE) 10 MG tablet, Ambulatory referral to Neurology  Muscle spasms of neck  - Plan: cyclobenzaprine (FLEXERIL) 5 MG tablet  Chronic daily headache   At this time due to worsening migraine/daily chronic headache a referral to neurology is warranted.  I do want the patient to stop taking her daily NSAIDs because I think this could be contributing to her daily headaches.  She has tried to stop before and the headaches got worse and so she went back on the Advil.  She states now that she understands the importance of no daily NSAIDs she will stop it and I offered help of prednisone and she accepted.  We discussed how to take the medications and what to expect when taking it.  We will change her muscle relaxer to see if she gets more help and less side effects.  She will make an appt with eye specialist for evaluation.  Benny LennertSarah Weber PA-C  [facility] 09/17/2014 10:04 PM

## 2014-09-17 NOTE — Patient Instructions (Signed)
We did the referral to neurology.  STOP the ADVIL and start the prednisone to help with rebound headache that you are going to get from stopping the ADVIL and you CANNOT start taking it again.  Make ab eye appt!!

## 2014-10-09 ENCOUNTER — Ambulatory Visit (INDEPENDENT_AMBULATORY_CARE_PROVIDER_SITE_OTHER): Payer: 59 | Admitting: Neurology

## 2014-10-09 ENCOUNTER — Encounter: Payer: Self-pay | Admitting: Neurology

## 2014-10-09 VITALS — BP 115/81 | HR 106 | Ht 66.5 in | Wt 196.0 lb

## 2014-10-09 DIAGNOSIS — G43009 Migraine without aura, not intractable, without status migrainosus: Secondary | ICD-10-CM | POA: Diagnosis not present

## 2014-10-09 MED ORDER — RIZATRIPTAN BENZOATE 10 MG PO TBDP: 10.0000 mg | ORAL_TABLET | ORAL | Status: DC | PRN

## 2014-10-09 MED ORDER — NORTRIPTYLINE HCL 10 MG PO CAPS: ORAL_CAPSULE | ORAL | Status: DC

## 2014-10-09 NOTE — Patient Instructions (Signed)
Magnesium oxide 400mg Riboflavin 100mg  Twice a day. 

## 2014-10-09 NOTE — Progress Notes (Signed)
PATIENT: Jamie Simmons DOB: 24-Oct-1990  HISTORICAL  Jamie Simmons is a 24 yo RH is referred by her PA Jamie Simmons, accompanied by her mother for evaluation of headaches.  She has PMHx of kidney stone, on contraceptives.  She reported 2 years history of frequent headaches, her typical headaches started from occipital region, spread forward to become retro-orbital area severe pounding headaches with associated light noise sensitivity, nauseous  Over the past 2 years, she had almost daily mild to moderate headaches, has been taking Advil 200 mg up to 8 tablets daily, about 3 weeks ago, in April 2016, she was treated with a round of steroid, was able to successfully wean herself off Advil, she was given a trial of beta blocker, did not help her headaches,  She is now having 2 out of 10, daily mild headaches, about once a week, it would be exacerbated to a more severe pounding headaches, lasting for whole day, she has tried over-the-counter medications, Exerdrine migraine, Imitrex, without help   She was made not able to identify any triggers  REVIEW OF SYSTEMS: Full 14 system review of systems performed and notable only for  insomnia, headaches, dizziness, feeling hot  ALLERGIES: No Known Allergies  HOME MEDICATIONS: Current Outpatient Prescriptions  Medication Sig Dispense Refill  . amphetamine-dextroamphetamine (ADDERALL) 30 MG tablet Take 1 tablet by mouth 3 (three) times daily. 90 tablet 0  . medroxyPROGESTERone (DEPO-PROVERA) 150 MG/ML injection Inject 150 mg into the muscle every 3 (three) months.       PAST MEDICAL HISTORY: Past Medical History  Diagnosis Date  . Attention deficit disorder   . Nephrolithiasis 02/2012  . Migraine     PAST SURGICAL HISTORY: Past Surgical History  Procedure Laterality Date  . Wisdom tooth extraction      FAMILY HISTORY: Family History  Problem Relation Age of Onset  . Drug abuse Mother   . Migraines Mother   . Diabetes  Father   . Obesity Father   . Hypertension Mother     SOCIAL HISTORY:  History   Social History  . Marital Status: Single    Spouse Name: n/a  . Number of Children: 0  . Years of Education: 16   Occupational History  . Account Representative    Social History Main Topics  . Smoking status: Never Smoker   . Smokeless tobacco: Never Used  . Alcohol Use: No  . Drug Use: No  . Sexual Activity: Yes    Birth Control/ Protection: Spermicide, Injection   Other Topics Concern  . Not on file   Social History Narrative   Lives with mother and mother's wife, and younger sister.   Right-handed.   2 cups caffeine daily.     PHYSICAL EXAM   Filed Vitals:   10/09/14 0819  BP: 115/81  Pulse: 106  Height: 5' 6.5" (1.689 m)  Weight: 196 lb (88.905 kg)    Not recorded      Body mass index is 31.16 kg/(m^2).  PHYSICAL EXAMNIATION:  Gen: NAD, conversant, well nourised, obese, well groomed                     Cardiovascular: Regular rate rhythm, no peripheral edema, warm, nontender. Eyes: Conjunctivae clear without exudates or hemorrhage Neck: Supple, no carotid bruise. Pulmonary: Clear to auscultation bilaterally   NEUROLOGICAL EXAM:  MENTAL STATUS: Speech:    Speech is normal; fluent and spontaneous with normal comprehension.  Cognition:    The  patient is oriented to person, place, and time;     recent and remote memory intact;     language fluent;     normal attention, concentration,     fund of knowledge.  CRANIAL NERVES: CN II: Visual fields are full to confrontation. Fundoscopic exam is normal with sharp discs and no vascular changes. Venous pulsations are present bilaterally. Pupils are 4 mm and briskly reactive to light. Visual acuity is 20/20 bilaterally. CN III, IV, VI: extraocular movement are normal. No ptosis. CN V: Facial sensation is intact to pinprick in all 3 divisions bilaterally. Corneal responses are intact.  CN VII: Face is symmetric with  normal eye closure and smile. CN VIII: Hearing is normal to rubbing fingers CN IX, X: Palate elevates symmetrically. Phonation is normal. CN XI: Head turning and shoulder shrug are intact CN XII: Tongue is midline with normal movements and no atrophy.  MOTOR: There is no pronator drift of out-stretched arms. Muscle bulk and tone are normal. Muscle strength is normal.   Shoulder abduction Shoulder external rotation Elbow flexion Elbow extension Wrist flexion Wrist extension Finger abduction Hip flexion Knee flexion Knee extension Ankle dorsi flexion Ankle plantar flexion  R 5 5 5 5 5 5 5 5 5 5 5 5   L 5 5 5 5 5 5 5 5 5 5 5 5     REFLEXES: Reflexes are 2+ and symmetric at the biceps, triceps, knees, and ankles. Plantar responses are flexor.  SENSORY: Light touch, pinprick, position sense, and vibration sense are intact in fingers and toes.  COORDINATION: Rapid alternating movements and fine finger movements are intact. There is no dysmetria on finger-to-nose and heel-knee-shin. There are no abnormal or extraneous movements.   GAIT/STANCE: Posture is normal. Gait is steady with normal steps, base, arm swing, and turning. Heel and toe walking are normal. Tandem gait is normal.  Romberg is absent.   DIAGNOSTIC DATA (LABS, IMAGING, TESTING) - I reviewed patient records, labs, notes, testing and imaging myself where available.  Lab Results  Component Value Date   HGB 14.3 10/29/2011   HCT 42.0 10/29/2011      Component Value Date/Time   NA 143 10/29/2011 2308   K 3.8 10/29/2011 2308   CL 106 10/29/2011 2308   GLUCOSE 84 10/29/2011 2308   BUN 9 10/29/2011 2308   CREATININE 0.80 10/29/2011 2308    ASSESSMENT AND PLAN  Jamie Simmons is a [age] y.o. female with past medical history of migraine headaches, likely a component of medicine rebound headaches,   1, start preventive medications nortriptyline, titrating to 10 mg 2 tablets every night 2, Maxalt as needed 3. Return to  clinic in 2 months   Jamie FeinsteinYijun Theressa Simmons, M.D. Ph.D.  [facility] [address], [address] [place], [place] [address] Ph: [phone] Fax: [phone]

## 2014-10-17 ENCOUNTER — Other Ambulatory Visit: Payer: Self-pay

## 2014-10-17 DIAGNOSIS — F988 Other specified behavioral and emotional disorders with onset usually occurring in childhood and adolescence: Secondary | ICD-10-CM

## 2014-10-17 NOTE — Telephone Encounter (Signed)
Patient is requesting a refill on her Adderall and she only has 2 days left. She wants to pick it up before she runs out if possible. Patient call back number [phone].

## 2014-10-18 MED ORDER — AMPHETAMINE-DEXTROAMPHETAMINE 30 MG PO TABS: 30.0000 mg | ORAL_TABLET | Freq: Three times a day (TID) | ORAL | Status: DC

## 2014-10-18 NOTE — Telephone Encounter (Signed)
PT CALLED AGAIN TO REQUEST THE REFILL OF HER ADDERALL.

## 2014-10-18 NOTE — Telephone Encounter (Signed)
ready

## 2014-10-18 NOTE — Telephone Encounter (Signed)
Pt notified that this is ready for pick up.

## 2014-10-23 ENCOUNTER — Telehealth: Payer: Self-pay | Admitting: Physician Assistant

## 2014-10-23 DIAGNOSIS — F988 Other specified behavioral and emotional disorders with onset usually occurring in childhood and adolescence: Secondary | ICD-10-CM

## 2014-10-23 MED ORDER — AMPHETAMINE-DEXTROAMPHETAMINE 30 MG PO TABS: 30.0000 mg | ORAL_TABLET | Freq: Three times a day (TID) | ORAL | Status: DC

## 2014-10-23 NOTE — Telephone Encounter (Signed)
Losing health insurance. Requests Adderall Rxs for June, July and August. Expect to have insurance again by then.  Meds ordered this encounter  Medications  . amphetamine-dextroamphetamine (ADDERALL) 30 MG tablet    Sig: Take 1 tablet by mouth 3 (three) times daily.    Dispense:  90 tablet    Refill:  0    May fill 11/17/2014    Order Specific Question:  Supervising Provider    Answer:  Merla RichesOLITTLE, ROBERT P [[id number]]  . amphetamine-dextroamphetamine (ADDERALL) 30 MG tablet    Sig: Take 1 tablet by mouth 3 (three) times daily.    Dispense:  90 tablet    Refill:  0    May fill 12/17/2014    Order Specific Question:  Supervising Provider    Answer:  Merla RichesOLITTLE, ROBERT P [[id number]]  . amphetamine-dextroamphetamine (ADDERALL) 30 MG tablet    Sig: Take 1 tablet by mouth 3 (three) times daily.    Dispense:  90 tablet    Refill:  0    May fill 01/16/2015    Order Specific Question:  Supervising Provider    Answer:  Ellamae SiaOLITTLE, ROBERT P [[id number]]

## 2014-12-18 ENCOUNTER — Ambulatory Visit (INDEPENDENT_AMBULATORY_CARE_PROVIDER_SITE_OTHER): Payer: Commercial Managed Care - PPO | Admitting: Neurology

## 2014-12-18 ENCOUNTER — Encounter: Payer: Self-pay | Admitting: Neurology

## 2014-12-18 VITALS — BP 137/89 | HR 122 | Ht 66.5 in | Wt 200.0 lb

## 2014-12-18 DIAGNOSIS — G43009 Migraine without aura, not intractable, without status migrainosus: Secondary | ICD-10-CM | POA: Diagnosis not present

## 2014-12-18 NOTE — Progress Notes (Signed)
Chief Complaint  Patient presents with  . Migraine    She is still getting daily headaches but feels the severity has decreased with nortiptyline.  She has not tried the Maxalt.      PATIENT: Jamie Simmons DOB: [birth year]  HISTORICAL (Initial visit in May 4th 2016)  Jamie Simmons is a 24 yo RH is referred by her PA Theora Gianotti, accompanied by her mother for evaluation of headaches.  She has PMHx of kidney stone, on contraceptives.  She reported 2 years history of frequent headaches, her typical headaches started from occipital region, spread forward to become retro-orbital area severe pounding headaches with associated light noise sensitivity, nauseous  Over the past 2 years, she had almost daily mild to moderate headaches, has been taking Advil 200 mg up to 8 tablets daily, about 3 weeks ago, in April 2016, she was treated with a round of steroid, was able to successfully wean herself off Advil, she was given a trial of beta blocker, did not help her headaches,  She is now having 2 out of 10, daily mild headaches, about once a week, it would be exacerbated to a more severe pounding headaches, lasting for whole day, she has tried over-the-counter medications, Exerdrine migraine, Imitrex, without help,  She complains of dizziness, nause, sweaty with imitrex.   She was not able to identify any triggers  UPDATE December 18 2014: She is tolerating nortriptyline 20 mg every night, her migraine has much improved, she only had a couple moderate to severe headaches, responding well to just 1 tablet of over-the-counter Advil, she did not try Maxalt, worry about the side effect.   REVIEW OF SYSTEMS: Full 14 system review of systems performed and notable only for headaches, flushing, heat intolerance  ALLERGIES: No Known Allergies  HOME MEDICATIONS: Current Outpatient Prescriptions  Medication Sig Dispense Refill  . amphetamine-dextroamphetamine (ADDERALL) 30 MG tablet Take 1 tablet by  mouth 3 (three) times daily. 90 tablet 0  . medroxyPROGESTERone (DEPO-PROVERA) 150 MG/ML injection Inject 150 mg into the muscle every 3 (three) months.       PAST MEDICAL HISTORY: Past Medical History  Diagnosis Date  . Attention deficit disorder   . Nephrolithiasis 02/2012  . Migraine     PAST SURGICAL HISTORY: Past Surgical History  Procedure Laterality Date  . Wisdom tooth extraction      FAMILY HISTORY: Family History  Problem Relation Age of Onset  . Drug abuse Mother   . Migraines Mother   . Diabetes Father   . Obesity Father   . Hypertension Mother     SOCIAL HISTORY:  History   Social History  . Marital Status: Single    Spouse Name: n/a  . Number of Children: 0  . Years of Education: 16   Occupational History  . Account Representative    Social History Main Topics  . Smoking status: Never Smoker   . Smokeless tobacco: Never Used  . Alcohol Use: No  . Drug Use: No  . Sexual Activity: Yes    Birth Control/ Protection: Spermicide, Injection   Other Topics Concern  . Not on file   Social History Narrative   Lives with mother and mother's wife, and younger sister.   Right-handed.   2 cups caffeine daily.     PHYSICAL EXAM   Filed Vitals:   12/18/14 0846  BP: 137/89  Pulse: 122  Height: 5' 6.5" (1.689 m)  Weight: 200 lb (90.719 kg)    Not recorded  Body mass index is 31.8 kg/(m^2).  PHYSICAL EXAMNIATION:  Gen: NAD, conversant, well nourised, obese, well groomed                     Cardiovascular: Regular rate rhythm, no peripheral edema, warm, nontender. Eyes: Conjunctivae clear without exudates or hemorrhage Neck: Supple, no carotid bruise. Pulmonary: Clear to auscultation bilaterally   NEUROLOGICAL EXAM:  MENTAL STATUS: Speech:    Speech is normal; fluent and spontaneous with normal comprehension.  Cognition:    The patient is oriented to person, place, and time;     recent and remote memory intact;     language  fluent;     normal attention, concentration,     fund of knowledge.  CRANIAL NERVES: CN II: Visual fields are full to confrontation. Fundoscopic exam is normal with sharp discs and no vascular changes. Venous pulsations are present bilaterally. Pupils are 4 mm and briskly reactive to light. Visual acuity is 20/20 bilaterally. CN III, IV, VI: extraocular movement are normal. No ptosis. CN V: Facial sensation is intact to pinprick in all 3 divisions bilaterally. Corneal responses are intact.  CN VII: Face is symmetric with normal eye closure and smile. CN VIII: Hearing is normal to rubbing fingers CN IX, X: Palate elevates symmetrically. Phonation is normal. CN XI: Head turning and shoulder shrug are intact CN XII: Tongue is midline with normal movements and no atrophy.  MOTOR: There is no pronator drift of out-stretched arms. Muscle bulk and tone are normal. Muscle strength is normal.  REFLEXES: Reflexes are 2+ and symmetric at the biceps, triceps, knees, and ankles. Plantar responses are flexor.  SENSORY: Light touch, pinprick, position sense, and vibration sense are intact in fingers and toes.  COORDINATION: Rapid alternating movements and fine finger movements are intact. There is no dysmetria on finger-to-nose and heel-knee-shin. There are no abnormal or extraneous movements.   GAIT/STANCE: Posture is normal. Gait is steady with normal steps, base, arm swing, and turning. Heel and toe walking are normal. Tandem gait is normal.  Romberg is absent.   DIAGNOSTIC DATA (LABS, IMAGING, TESTING) - I reviewed patient records, labs, notes, testing and imaging myself where available.  Lab Results  Component Value Date   HGB 14.3 10/29/2011   HCT 42.0 10/29/2011      Component Value Date/Time   NA 143 10/29/2011 2308   K 3.8 10/29/2011 2308   CL 106 10/29/2011 2308   GLUCOSE 84 10/29/2011 2308   BUN 9 10/29/2011 2308   CREATININE 0.80 10/29/2011 2308    ASSESSMENT AND  PLAN  Orland DecStephanie E Ke is a 24 y.o. female with past medical history of migraine headaches, likely a component of medicine rebound headaches, her migraine has much improved with current preventive medications  1, keep preventive medications nortriptyline 20 mg every night 2, Maxalt as needed 3. Return to clinic in 3 months with nurse practitioner   Levert FeinsteinYijun Aralynn Brake, M.D. Ph.D.  [facility] [address], [address] [place], [place] [address] Ph: [phone] Fax: [phone]

## 2015-02-13 ENCOUNTER — Other Ambulatory Visit: Payer: Self-pay

## 2015-02-13 DIAGNOSIS — F988 Other specified behavioral and emotional disorders with onset usually occurring in childhood and adolescence: Secondary | ICD-10-CM

## 2015-02-13 NOTE — Telephone Encounter (Signed)
Patient needs a refill on amphetamine-dextroamphetamine (ADDERALL) 30 MG tablet [[id number]]

## 2015-02-14 NOTE — Telephone Encounter (Signed)
Patient called to check on the status of the refill request. I informed her that it's just waiting for approval.

## 2015-02-14 NOTE — Telephone Encounter (Signed)
Patient called to see if another provider can approve her adderall it is due to be filled tomorrow.   [phone]

## 2015-02-17 MED ORDER — AMPHETAMINE-DEXTROAMPHETAMINE 30 MG PO TABS: 30.0000 mg | ORAL_TABLET | Freq: Three times a day (TID) | ORAL | Status: DC

## 2015-02-17 NOTE — Telephone Encounter (Signed)
Rx printed.  Meds ordered this encounter  Medications  . amphetamine-dextroamphetamine (ADDERALL) 30 MG tablet    Sig: Take 1 tablet by mouth 3 (three) times daily.    Dispense:  90 tablet    Refill:  0

## 2015-02-17 NOTE — Telephone Encounter (Signed)
Amy called to verify rx was ready for pick up/// PT was informed rx is ready for pick-up.

## 2015-03-17 ENCOUNTER — Other Ambulatory Visit: Payer: Self-pay | Admitting: Physician Assistant

## 2015-03-17 DIAGNOSIS — F988 Other specified behavioral and emotional disorders with onset usually occurring in childhood and adolescence: Secondary | ICD-10-CM

## 2015-03-17 NOTE — Telephone Encounter (Signed)
Adderall refill.  [phone]

## 2015-03-18 MED ORDER — AMPHETAMINE-DEXTROAMPHETAMINE 30 MG PO TABS: 30.0000 mg | ORAL_TABLET | Freq: Three times a day (TID) | ORAL | Status: DC

## 2015-03-18 NOTE — Telephone Encounter (Signed)
Rx printed at 104. Will bring to 102 after clinic.  Meds ordered this encounter  Medications  . amphetamine-dextroamphetamine (ADDERALL) 30 MG tablet    Sig: Take 1 tablet by mouth 3 (three) times daily.    Dispense:  90 tablet    Refill:  0    Order Specific Question:  Supervising Provider    Answer:  DOOLITTLE, ROBERT P [[id number]]

## 2015-03-19 NOTE — Telephone Encounter (Signed)
Notified pt on VM ready at 102. 

## 2015-03-20 ENCOUNTER — Ambulatory Visit: Payer: Commercial Managed Care - PPO | Admitting: Nurse Practitioner

## 2015-04-17 ENCOUNTER — Other Ambulatory Visit: Payer: Self-pay | Admitting: Physician Assistant

## 2015-04-17 DIAGNOSIS — F988 Other specified behavioral and emotional disorders with onset usually occurring in childhood and adolescence: Secondary | ICD-10-CM

## 2015-04-17 NOTE — Telephone Encounter (Signed)
Pt req. Adderal Refill   [phone]

## 2015-04-18 MED ORDER — AMPHETAMINE-DEXTROAMPHETAMINE 30 MG PO TABS: 30.0000 mg | ORAL_TABLET | Freq: Three times a day (TID) | ORAL | Status: DC

## 2015-04-18 NOTE — Telephone Encounter (Signed)
LMOM that rx is ready for pickup 

## 2015-04-18 NOTE — Telephone Encounter (Signed)
rx printed.  Meds ordered this encounter  Medications  . amphetamine-dextroamphetamine (ADDERALL) 30 MG tablet    Sig: Take 1 tablet by mouth 3 (three) times daily.    Dispense:  90 tablet    Refill:  0    Order Specific Question:  Supervising Provider    Answer:  DOOLITTLE, ROBERT P [[id number]]

## 2015-05-16 ENCOUNTER — Telehealth: Payer: Self-pay

## 2015-05-16 DIAGNOSIS — F988 Other specified behavioral and emotional disorders with onset usually occurring in childhood and adolescence: Secondary | ICD-10-CM

## 2015-05-16 NOTE — Telephone Encounter (Signed)
Pt stated it was second time calling in for refill on ADDERALL.  Please advise  [phone]

## 2015-05-18 NOTE — Telephone Encounter (Signed)
She has requested Adderall, appears she is due for follow up / there is nothing sch/ please advise

## 2015-05-19 MED ORDER — AMPHETAMINE-DEXTROAMPHETAMINE 30 MG PO TABS: 30.0000 mg | ORAL_TABLET | Freq: Three times a day (TID) | ORAL | Status: DC

## 2015-05-19 NOTE — Telephone Encounter (Signed)
Rx printed.  Please notify patient that she needs OV for next fill.

## 2015-05-19 NOTE — Telephone Encounter (Signed)
Called pt and advised RX ready for pick up  

## 2015-06-17 ENCOUNTER — Telehealth: Payer: Self-pay

## 2015-06-17 DIAGNOSIS — F988 Other specified behavioral and emotional disorders with onset usually occurring in childhood and adolescence: Secondary | ICD-10-CM

## 2015-06-17 MED ORDER — AMPHETAMINE-DEXTROAMPHETAMINE 30 MG PO TABS: 30.0000 mg | ORAL_TABLET | Freq: Three times a day (TID) | ORAL | Status: DC

## 2015-06-17 NOTE — Telephone Encounter (Signed)
Please advise patient that she needs OV for additional fills.  Rx printed at 104. Will bring to 102 after clinic.  Meds ordered this encounter  Medications  . amphetamine-dextroamphetamine (ADDERALL) 30 MG tablet    Sig: Take 1 tablet by mouth 3 (three) times daily.    Dispense:  90 tablet    Refill:  0    Order Specific Question:  Supervising Provider    Answer:  DOOLITTLE, ROBERT P [[id number]]

## 2015-06-17 NOTE — Telephone Encounter (Signed)
Patient is calling to request a refill adderral

## 2015-06-18 NOTE — Telephone Encounter (Signed)
Mom advised

## 2015-07-09 ENCOUNTER — Ambulatory Visit (INDEPENDENT_AMBULATORY_CARE_PROVIDER_SITE_OTHER): Payer: Commercial Managed Care - PPO | Admitting: Physician Assistant

## 2015-07-09 VITALS — BP 120/80 | HR 102 | Temp 97.8°F | Resp 17 | Ht 66.0 in | Wt 199.0 lb

## 2015-07-09 DIAGNOSIS — F988 Other specified behavioral and emotional disorders with onset usually occurring in childhood and adolescence: Secondary | ICD-10-CM

## 2015-07-09 DIAGNOSIS — F909 Attention-deficit hyperactivity disorder, unspecified type: Secondary | ICD-10-CM | POA: Diagnosis not present

## 2015-07-09 DIAGNOSIS — N946 Dysmenorrhea, unspecified: Secondary | ICD-10-CM

## 2015-07-09 MED ORDER — AMPHETAMINE-DEXTROAMPHETAMINE 30 MG PO TABS: 30.0000 mg | ORAL_TABLET | Freq: Three times a day (TID) | ORAL | Status: DC

## 2015-07-09 MED ORDER — MEDROXYPROGESTERONE ACETATE 150 MG/ML IM SUSP
150.0000 mg | Freq: Once | INTRAMUSCULAR | Status: AC
  Administered 2015-07-09: 150 mg via INTRAMUSCULAR

## 2015-07-09 NOTE — Progress Notes (Signed)
Patient ID: Jamie Simmons, female    DOB: 07/30/1990, 25 y.o.   MRN: [id number]  PCP: Olene Floss  Subjective:   Chief Complaint  Patient presents with  . Medication Refill    HPI Presents for medication refills.  Feels like everything is going well, and only came in because she was told Adderall wouldn't be refilled without an OV.  In addition, she's interested in restarting Depo-Provera for dysmenorrhea. She's just had her first menstrual bleed since stopping it about a year ago, "and now I remember why I started it!"  Has never been sexually active.  She's due for cervical cancer screening, though she is very low risk, and I think it is reasonable to defer that.  She declines influenza vaccine and HIV screening as well.    Review of Systems  Constitutional: Negative for fever and chills.  Eyes: Negative for visual disturbance.  Respiratory: Negative for cough, shortness of breath and wheezing.   Cardiovascular: Negative for chest pain, palpitations and leg swelling.  Genitourinary: Positive for vaginal bleeding and menstrual problem (dysmenorrhea).  Neurological: Negative for dizziness, weakness and headaches.  Psychiatric/Behavioral: Negative for dysphoric mood and decreased concentration. The patient is not nervous/anxious.        Patient Active Problem List   Diagnosis Date Noted  . Chronic daily headache 09/17/2014  . Dysmenorrhea 12/28/2011  . ADD (attention deficit disorder) 09/29/2011     Prior to Admission medications   Medication Sig Start Date End Date Taking? Authorizing Provider  amphetamine-dextroamphetamine (ADDERALL) 30 MG tablet Take 1 tablet by mouth 3 (three) times daily. 06/17/15  Yes Amarri Satterly, PA-C  nortriptyline (PAMELOR) 10 MG capsule One po qhs xone week, 2tabs po qhs Patient not taking: Reported on 07/09/2015 10/09/14   Levert Feinstein, MD  rizatriptan (MAXALT-MLT) 10 MG disintegrating tablet Take 1 tablet (10 mg total) by mouth  as needed. May repeat in 2 hours if needed Patient not taking: Reported on 07/09/2015 10/09/14   Levert Feinstein, MD     No Known Allergies     Objective:  Physical Exam  Constitutional: She is oriented to person, place, and time. She appears well-developed and well-nourished. She is active and cooperative. No distress.  BP 120/80 mmHg  Pulse 102  Temp(Src) 97.8 F (36.6 C) (Oral)  Resp 17  Ht  (1.676 m)  Wt 199 lb (90.266 kg)  BMI 32.13 kg/m2  SpO2 96%  LMP 07/02/2015  HENT:  Head: Normocephalic and atraumatic.  Right Ear: Hearing normal.  Left Ear: Hearing normal.  Eyes: Conjunctivae are normal. No scleral icterus.  Neck: Normal range of motion. Neck supple. No thyromegaly present.  Cardiovascular: Normal rate, regular rhythm and normal heart sounds.   Pulses:      Radial pulses are 2+ on the right side, and 2+ on the left side.  Pulmonary/Chest: Effort normal and breath sounds normal.  Lymphadenopathy:       Head (right side): No tonsillar, no preauricular, no posterior auricular and no occipital adenopathy present.       Head (left side): No tonsillar, no preauricular, no posterior auricular and no occipital adenopathy present.    She has no cervical adenopathy.       Right: No supraclavicular adenopathy present.       Left: No supraclavicular adenopathy present.  Neurological: She is alert and oriented to person, place, and time. No sensory deficit.  Skin: Skin is warm, dry and intact. No rash noted. No cyanosis or erythema.  Nails show no clubbing.  Psychiatric: She has a normal mood and affect. Her speech is normal and behavior is normal.           Assessment & Plan:   1. ADD (attention deficit disorder) Continue current treatment. - amphetamine-dextroamphetamine (ADDERALL) 30 MG tablet; Take 1 tablet by mouth 3 (three) times daily.  Dispense: 90 tablet; Refill: 0  2. Dysmenorrhea Resume Depo-Provera. - medroxyPROGESTERone (DEPO-PROVERA) injection 150 mg;  Inject 1 mL (150 mg total) into the muscle once.   Fernande Bras, PA-C Physician Assistant-Certified [facility]

## 2015-08-12 ENCOUNTER — Telehealth: Payer: Self-pay

## 2015-08-12 DIAGNOSIS — F988 Other specified behavioral and emotional disorders with onset usually occurring in childhood and adolescence: Secondary | ICD-10-CM

## 2015-08-12 MED ORDER — AMPHETAMINE-DEXTROAMPHETAMINE 30 MG PO TABS: 30.0000 mg | ORAL_TABLET | Freq: Three times a day (TID) | ORAL | Status: DC

## 2015-08-12 NOTE — Telephone Encounter (Signed)
Rx printed at 104. Will bring to 102 after clinic.  Meds ordered this encounter  Medications  . amphetamine-dextroamphetamine (ADDERALL) 30 MG tablet    Sig: Take 1 tablet by mouth 3 (three) times daily.    Dispense:  90 tablet    Refill:  0    Order Specific Question:  Supervising Provider    Answer:  DOOLITTLE, ROBERT P [[id number]]

## 2015-08-12 NOTE — Telephone Encounter (Addendum)
Pt in need of her ADDERALL 30 MG. Please call [phone]

## 2015-08-12 NOTE — Telephone Encounter (Signed)
Pt advised.

## 2015-09-12 ENCOUNTER — Telehealth: Payer: Self-pay

## 2015-09-12 DIAGNOSIS — F988 Other specified behavioral and emotional disorders with onset usually occurring in childhood and adolescence: Secondary | ICD-10-CM

## 2015-09-12 NOTE — Telephone Encounter (Signed)
Pt is needing a refill on her adderall   Best number [phone]

## 2015-09-13 MED ORDER — AMPHETAMINE-DEXTROAMPHETAMINE 30 MG PO TABS: 30.0000 mg | ORAL_TABLET | Freq: Three times a day (TID) | ORAL | Status: DC

## 2015-09-13 NOTE — Telephone Encounter (Signed)
Meds ordered this encounter  Medications  . amphetamine-dextroamphetamine (ADDERALL) 30 MG tablet    Sig: Take 1 tablet by mouth 3 (three) times daily.    Dispense:  90 tablet    Refill:  0    Order Specific Question:  Supervising Provider    Answer:  DOOLITTLE, ROBERT P [[id number]]    

## 2015-10-09 ENCOUNTER — Telehealth: Payer: Self-pay

## 2015-10-09 ENCOUNTER — Ambulatory Visit (INDEPENDENT_AMBULATORY_CARE_PROVIDER_SITE_OTHER): Payer: Commercial Managed Care - PPO

## 2015-10-09 DIAGNOSIS — Z30019 Encounter for initial prescription of contraceptives, unspecified: Secondary | ICD-10-CM

## 2015-10-09 DIAGNOSIS — Z30013 Encounter for initial prescription of injectable contraceptive: Secondary | ICD-10-CM | POA: Diagnosis not present

## 2015-10-09 DIAGNOSIS — F988 Other specified behavioral and emotional disorders with onset usually occurring in childhood and adolescence: Secondary | ICD-10-CM

## 2015-10-09 MED ORDER — MEDROXYPROGESTERONE ACETATE 150 MG/ML IM SUSP
150.0000 mg | Freq: Once | INTRAMUSCULAR | Status: AC
  Administered 2015-10-09: 150 mg via INTRAMUSCULAR

## 2015-10-09 NOTE — Telephone Encounter (Signed)
Chelle, Please refill pt's amphetamine-dextroamphetamine (ADDERALL) 30 MG tablet [[id number]][[id number]]

## 2015-10-13 MED ORDER — AMPHETAMINE-DEXTROAMPHETAMINE 30 MG PO TABS: 30.0000 mg | ORAL_TABLET | Freq: Three times a day (TID) | ORAL | Status: DC

## 2015-10-13 NOTE — Telephone Encounter (Signed)
Notified pt ready on Vm 

## 2015-10-13 NOTE — Telephone Encounter (Signed)
Meds ordered this encounter  Medications  . amphetamine-dextroamphetamine (ADDERALL) 30 MG tablet    Sig: Take 1 tablet by mouth 3 (three) times daily.    Dispense:  90 tablet    Refill:  0    Order Specific Question:  Supervising Provider    Answer:  DOOLITTLE, ROBERT P [[id number]]    

## 2015-10-31 ENCOUNTER — Telehealth: Payer: Self-pay | Admitting: Physician Assistant

## 2015-10-31 DIAGNOSIS — F988 Other specified behavioral and emotional disorders with onset usually occurring in childhood and adolescence: Secondary | ICD-10-CM

## 2015-10-31 MED ORDER — AMPHETAMINE-DEXTROAMPHETAMINE 30 MG PO TABS: 30.0000 mg | ORAL_TABLET | Freq: Three times a day (TID) | ORAL | Status: DC

## 2015-10-31 NOTE — Telephone Encounter (Signed)
Mother is coming in today for a visit. Patient asks that mother be able to pick up her next adderall prescription, to prevent the delay that she experienced last month. Not due to fill until next week.

## 2015-12-04 ENCOUNTER — Other Ambulatory Visit: Payer: Self-pay

## 2015-12-04 DIAGNOSIS — F988 Other specified behavioral and emotional disorders with onset usually occurring in childhood and adolescence: Secondary | ICD-10-CM

## 2015-12-04 NOTE — Telephone Encounter (Signed)
Patient is calling to request a refill for adderral.  [phone]

## 2015-12-08 ENCOUNTER — Telehealth: Payer: Self-pay | Admitting: Radiology

## 2015-12-08 ENCOUNTER — Telehealth: Payer: Self-pay

## 2015-12-08 DIAGNOSIS — F988 Other specified behavioral and emotional disorders with onset usually occurring in childhood and adolescence: Secondary | ICD-10-CM

## 2015-12-08 MED ORDER — AMPHETAMINE-DEXTROAMPHETAMINE 30 MG PO TABS: 30.0000 mg | ORAL_TABLET | Freq: Three times a day (TID) | ORAL | Status: DC

## 2015-12-08 NOTE — Telephone Encounter (Signed)
Spoke with pts mother and informed her that pt needs to RTC  In order to get refill. Her last visit was feb/2017

## 2015-12-08 NOTE — Telephone Encounter (Signed)
Rx has been printed.

## 2015-12-08 NOTE — Telephone Encounter (Signed)
Patient is requesting a refill on amphetamine-dextroamphetamine (ADDERALL) 30 MG tablet [[id number]][[id number]] please. Her mom called in reference to this refill on 12/04/15 and she hasn't received a response to it yet.

## 2015-12-08 NOTE — Telephone Encounter (Signed)
Pt p/up. 

## 2015-12-08 NOTE — Telephone Encounter (Signed)
Pt called back in reference to adderrall prescription refill. I saw in previous communication that you noted she had a refill coming up this week. Please advise.

## 2015-12-08 NOTE — Telephone Encounter (Signed)
Meds ordered this encounter  Medications  . amphetamine-dextroamphetamine (ADDERALL) 30 MG tablet    Sig: Take 1 tablet by mouth 3 (three) times daily.    Dispense:  90 tablet    Refill:  0    Order Specific Question:  Supervising Provider    Answer:  DOOLITTLE, ROBERT P [[id number]]    

## 2015-12-15 ENCOUNTER — Ambulatory Visit (INDEPENDENT_AMBULATORY_CARE_PROVIDER_SITE_OTHER): Payer: Commercial Managed Care - PPO | Admitting: Family Medicine

## 2015-12-15 VITALS — BP 132/90 | HR 106 | Temp 99.5°F | Resp 18 | Ht 66.0 in | Wt 272.0 lb

## 2015-12-15 DIAGNOSIS — R110 Nausea: Secondary | ICD-10-CM | POA: Diagnosis not present

## 2015-12-15 MED ORDER — ONDANSETRON 4 MG PO TBDP: 4.0000 mg | ORAL_TABLET | Freq: Three times a day (TID) | ORAL | Status: DC | PRN

## 2015-12-15 NOTE — Progress Notes (Signed)
Subjective:  By signing my name below, I, Stann Ore, attest that this documentation has been prepared under the direction and in the presence of Meredith Staggers, MD. Electronically Signed: Stann Ore, Scribe. 12/15/2015 , 5:13 PM .  Patient was seen in Room 12 .   Patient ID: Jamie Simmons, female    DOB: 06/16/1990, 25 y.o.   MRN: [id number] Chief Complaint  Patient presents with  . Nausea   HPI Jamie Simmons is a 25 y.o. female Here for nausea. She takes adderall for ADD. H/o headache but not currently on medication. Patient's last menstrual period was 11/06/2015 (approximate).   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 She's been having nausea; denies vomiting or diarrhea. She reports the nausea kept her up from sleep last night. She was able to take an old zofran once at 3:00AM and able to fall back asleep. She notes feeling much better today. She has been able to keep fluids down and drinking plenty of fluids. She denies urinary symptoms, back pain or fever.   She notes having a period about once every 6 months. She receives her depo shots here. She denies missing any depo shots, last done on May 4th. She denies chance of pregnancy.   Patient Active Problem List   Diagnosis Date Noted  . Chronic daily headache 09/17/2014  . Dysmenorrhea 12/28/2011  . ADD (attention deficit disorder) 09/29/2011   Past Medical History  Diagnosis Date  . Attention deficit disorder   . Nephrolithiasis 02/2012  . Migraine    Past Surgical History  Procedure Laterality Date  . Wisdom tooth extraction     No Known Allergies Prior to Admission medications   Medication Sig Start Date End Date Taking? Authorizing Provider  amphetamine-dextroamphetamine (ADDERALL) 30 MG tablet Take 1 tablet by mouth 3 (three) times daily. 12/08/15  Yes Chelle Jeffery, PA-C  nortriptyline (PAMELOR) 10 MG capsule One  po qhs xone week, 2tabs po qhs Patient not taking: Reported on 07/09/2015 10/09/14   Levert Feinstein, MD  rizatriptan (MAXALT-MLT) 10 MG disintegrating tablet Take 1 tablet (10 mg total) by mouth as needed. May repeat in 2 hours if needed Patient not taking: Reported on 07/09/2015 10/09/14   Levert Feinstein, MD   Social History   Social History  . Marital Status: Single    Spouse Name: n/a  . Number of Children: 0  . Years of Education: 16   Occupational History  . Account Representative    Social History Main Topics  . Smoking status: Never Smoker   . Smokeless tobacco: Never Used  . Alcohol Use: No  . Drug Use: No  . Sexual Activity: Yes    Birth Control/ Protection: Spermicide, Injection   Other Topics Concern  . Not on file   Social History Narrative   Lives with mother and mother's wife, and younger sister.   Right-handed.   2 cups caffeine daily.   Review of Systems  Constitutional: Negative for fever, chills and fatigue.  Gastrointestinal: Positive for nausea. Negative for vomiting, abdominal pain, diarrhea and constipation.  Genitourinary: Negative for dysuria, urgency, frequency and hematuria.  Musculoskeletal: Negative for back pain.       Objective:   Physical Exam  Constitutional: She is oriented to person, place, and time. She appears well-developed and well-nourished. No distress.  HENT:  Head: Normocephalic and atraumatic.  Eyes: EOM are normal. Pupils are  equal, round, and reactive to light.  Neck: Neck supple.  Cardiovascular: Normal rate.   Pulmonary/Chest: Effort normal. No respiratory distress.  Abdominal: Soft. There is no tenderness. There is no CVA tenderness, no tenderness at McBurney's point and negative Murphy's sign.  Musculoskeletal: Normal range of motion.  Neurological: She is alert and oriented to person, place, and time.  Skin: Skin is warm and dry.  Psychiatric: She has a normal mood and affect. Her behavior is normal.  Nursing note and vitals  reviewed.   Filed Vitals:   12/15/15 1608  BP: 132/90  Pulse: 106  Temp: 99.5 F (37.5 C)  TempSrc: Oral  Resp: 18  Height: 5\' 6"  (1.676 m)  Weight: 272 lb (123.378 kg)  SpO2: 99%      Assessment & Plan:   Jamie Simmons is a 25 y.o. female Nausea without vomiting - Plan: ondansetron (ZOFRAN ODT) 4 MG disintegrating tablet   -probable viral illness with sick contacts. Abdomen nontender, no other concerning findings on exam or history. Symptoms are improving. Prescription for Zofran given if needed, small sips of fluids and symptomatic care discussed. RTC precautions discussed. Note given to return to work tomorrow, out today.   Meds ordered this encounter  Medications  . ondansetron (ZOFRAN ODT) 4 MG disintegrating tablet    Sig: Take 1 tablet (4 mg total) by mouth every 8 (eight) hours as needed for nausea or vomiting.    Dispense:  10 tablet    Refill:  0   Patient Instructions       IF you received an x-ray today, you will receive an invoice from [facility]. Please contact [facility] at [phone] with questions or concerns regarding your invoice.   IF you received labwork today, you will receive an invoice from [facility]. Please contact [facility] at [phone] with questions or concerns regarding your invoice.   Our billing staff will not be able to assist you with questions regarding bills from these companies.  You will be contacted with the lab results as soon as they are available. The fastest way to get your results is to activate your My Chart account. Instructions are located on the last page of this paperwork. If you have not heard from us regarding the results in 2 weeks, please contact this office.     Your nausea is likely due to a viral illness. As it is improving, no other testing needed at this time. Zofran was prescribed if needed, small sips of fluids frequently, and bland foods for the next day or  2. If you have any worsening of symptoms as we discussed, return for recheck here or other medical provider.  Return to the clinic or go to the nearest emergency room if any of your symptoms worsen or new symptoms occur.  Nausea and Vomiting Nausea is a sick feeling that often comes before throwing up (vomiting). Vomiting is a reflex where stomach contents come out of your mouth. Vomiting can cause severe loss of body fluids (dehydration). Children and elderly adults can become dehydrated quickly, especially if they also have diarrhea. Nausea and vomiting are symptoms of a condition or disease. It is important to find the cause of your symptoms. CAUSES   Direct irritation of the stomach lining. This irritation can result from increased acid production (gastroesophageal reflux disease), infection, food poisoning, taking certain medicines (such as nonsteroidal anti-inflammatory drugs), alcohol use, or tobacco use.  Signals from the brain.These signals could be caused by  a headache, heat exposure, an inner ear disturbance, increased pressure in the brain from injury, infection, a tumor, or a concussion, pain, emotional stimulus, or metabolic problems.  An obstruction in the gastrointestinal tract (bowel obstruction).  Illnesses such as diabetes, hepatitis, gallbladder problems, appendicitis, kidney problems, cancer, sepsis, atypical symptoms of a heart attack, or eating disorders.  Medical treatments such as chemotherapy and radiation.  Receiving medicine that makes you sleep (general anesthetic) during surgery. DIAGNOSIS Your caregiver may ask for tests to be done if the problems do not improve after a few days. Tests may also be done if symptoms are severe or if the reason for the nausea and vomiting is not clear. Tests may include:  Urine tests.  Blood tests.  Stool tests.  Cultures (to look for evidence of infection).  X-rays or other imaging studies. Test results can help your  caregiver make decisions about treatment or the need for additional tests. TREATMENT You need to stay well hydrated. Drink frequently but in small amounts.You may wish to drink water, sports drinks, clear broth, or eat frozen ice pops or gelatin dessert to help stay hydrated.When you eat, eating slowly may help prevent nausea.There are also some antinausea medicines that may help prevent nausea. HOME CARE INSTRUCTIONS   Take all medicine as directed by your caregiver.  If you do not have an appetite, do not force yourself to eat. However, you must continue to drink fluids.  If you have an appetite, eat a normal diet unless your caregiver tells you differently.  Eat a variety of complex carbohydrates (rice, wheat, potatoes, bread), lean meats, yogurt, fruits, and vegetables.  Avoid high-fat foods because they are more difficult to digest.  Drink enough water and fluids to keep your urine clear or pale yellow.  If you are dehydrated, ask your caregiver for specific rehydration instructions. Signs of dehydration may include:  Severe thirst.  Dry lips and mouth.  Dizziness.  Dark urine.  Decreasing urine frequency and amount.  Confusion.  Rapid breathing or pulse. SEEK IMMEDIATE MEDICAL CARE IF:   You have blood or brown flecks (like coffee grounds) in your vomit.  You have black or bloody stools.  You have a severe headache or stiff neck.  You are confused.  You have severe abdominal pain.  You have chest pain or trouble breathing.  You do not urinate at least once every 8 hours.  You develop cold or clammy skin.  You continue to vomit for longer than 24 to 48 hours.  You have a fever. MAKE SURE YOU:   Understand these instructions.  Will watch your condition.  Will get help right away if you are not doing well or get worse.   This information is not intended to replace advice given to you by your health care provider. Make sure you discuss any questions  you have with your health care provider.   Document Released: 05/24/2005 Document Revised: 08/16/2011 Document Reviewed: 10/21/2010 Elsevier Interactive Patient Education [employer]     I personally performed the services described in this documentation, which was scribed in my presence. The recorded information has been reviewed and considered, and addended by me as needed.   Signed,   Meredith Staggers, MD [facility].  12/15/2015 5:14 PM

## 2015-12-15 NOTE — Patient Instructions (Addendum)
IF you received an x-ray today, you will receive an invoice from [facility]. Please contact [facility] at [phone] with questions or concerns regarding your invoice.   IF you received labwork today, you will receive an invoice from [facility]. Please contact [facility] at [phone] with questions or concerns regarding your invoice.   Our billing staff will not be able to assist you with questions regarding bills from these companies.  You will be contacted with the lab results as soon as they are available. The fastest way to get your results is to activate your My Chart account. Instructions are located on the last page of this paperwork. If you have not heard from us regarding the results in 2 weeks, please contact this office.     Your nausea is likely due to a viral illness. As it is improving, no other testing needed at this time. Zofran was prescribed if needed, small sips of fluids frequently, and bland foods for the next day or 2. If you have any worsening of symptoms as we discussed, return for recheck here or other medical provider.  Return to the clinic or go to the nearest emergency room if any of your symptoms worsen or new symptoms occur.  Nausea and Vomiting Nausea is a sick feeling that often comes before throwing up (vomiting). Vomiting is a reflex where stomach contents come out of your mouth. Vomiting can cause severe loss of body fluids (dehydration). Children and elderly adults can become dehydrated quickly, especially if they also have diarrhea. Nausea and vomiting are symptoms of a condition or disease. It is important to find the cause of your symptoms. CAUSES   Direct irritation of the stomach lining. This irritation can result from increased acid production (gastroesophageal reflux disease), infection, food poisoning, taking certain medicines (such as nonsteroidal anti-inflammatory drugs), alcohol use, or tobacco  use.  Signals from the brain.These signals could be caused by a headache, heat exposure, an inner ear disturbance, increased pressure in the brain from injury, infection, a tumor, or a concussion, pain, emotional stimulus, or metabolic problems.  An obstruction in the gastrointestinal tract (bowel obstruction).  Illnesses such as diabetes, hepatitis, gallbladder problems, appendicitis, kidney problems, cancer, sepsis, atypical symptoms of a heart attack, or eating disorders.  Medical treatments such as chemotherapy and radiation.  Receiving medicine that makes you sleep (general anesthetic) during surgery. DIAGNOSIS Your caregiver may ask for tests to be done if the problems do not improve after a few days. Tests may also be done if symptoms are severe or if the reason for the nausea and vomiting is not clear. Tests may include:  Urine tests.  Blood tests.  Stool tests.  Cultures (to look for evidence of infection).  X-rays or other imaging studies. Test results can help your caregiver make decisions about treatment or the need for additional tests. TREATMENT You need to stay well hydrated. Drink frequently but in small amounts.You may wish to drink water, sports drinks, clear broth, or eat frozen ice pops or gelatin dessert to help stay hydrated.When you eat, eating slowly may help prevent nausea.There are also some antinausea medicines that may help prevent nausea. HOME CARE INSTRUCTIONS   Take all medicine as directed by your caregiver.  If you do not have an appetite, do not force yourself to eat. However, you must continue to drink fluids.  If you have an appetite, eat a normal diet unless your caregiver tells you differently.  Eat a  variety of complex carbohydrates (rice, wheat, potatoes, bread), lean meats, yogurt, fruits, and vegetables.  Avoid high-fat foods because they are more difficult to digest.  Drink enough water and fluids to keep your urine clear or pale  yellow.  If you are dehydrated, ask your caregiver for specific rehydration instructions. Signs of dehydration may include:  Severe thirst.  Dry lips and mouth.  Dizziness.  Dark urine.  Decreasing urine frequency and amount.  Confusion.  Rapid breathing or pulse. SEEK IMMEDIATE MEDICAL CARE IF:   You have blood or brown flecks (like coffee grounds) in your vomit.  You have black or bloody stools.  You have a severe headache or stiff neck.  You are confused.  You have severe abdominal pain.  You have chest pain or trouble breathing.  You do not urinate at least once every 8 hours.  You develop cold or clammy skin.  You continue to vomit for longer than 24 to 48 hours.  You have a fever. MAKE SURE YOU:   Understand these instructions.  Will watch your condition.  Will get help right away if you are not doing well or get worse.   This information is not intended to replace advice given to you by your health care provider. Make sure you discuss any questions you have with your health care provider.   Document Released: 05/24/2005 Document Revised: 08/16/2011 Document Reviewed: 10/21/2010 Elsevier Interactive Patient Education [employer]

## 2016-01-06 ENCOUNTER — Ambulatory Visit (INDEPENDENT_AMBULATORY_CARE_PROVIDER_SITE_OTHER): Payer: Commercial Managed Care - PPO | Admitting: Family Medicine

## 2016-01-06 VITALS — BP 112/84 | HR 113 | Temp 98.4°F | Resp 16 | Ht 65.5 in | Wt 208.6 lb

## 2016-01-06 DIAGNOSIS — F909 Attention-deficit hyperactivity disorder, unspecified type: Secondary | ICD-10-CM | POA: Diagnosis not present

## 2016-01-06 DIAGNOSIS — N946 Dysmenorrhea, unspecified: Secondary | ICD-10-CM

## 2016-01-06 DIAGNOSIS — F988 Other specified behavioral and emotional disorders with onset usually occurring in childhood and adolescence: Secondary | ICD-10-CM

## 2016-01-06 DIAGNOSIS — R51 Headache: Secondary | ICD-10-CM | POA: Diagnosis not present

## 2016-01-06 DIAGNOSIS — R519 Headache, unspecified: Secondary | ICD-10-CM

## 2016-01-06 MED ORDER — AMPHETAMINE-DEXTROAMPHETAMINE 30 MG PO TABS: 30.0000 mg | ORAL_TABLET | Freq: Three times a day (TID) | ORAL | 0 refills | Status: DC

## 2016-01-06 MED ORDER — MEDROXYPROGESTERONE ACETATE 400 MG/ML IM SUSP
150.0000 mg | Freq: Once | INTRAMUSCULAR | Status: AC
  Administered 2016-01-06: 152 mg via INTRAMUSCULAR

## 2016-01-06 NOTE — Progress Notes (Signed)
Subjective:    Patient ID: JOYCELIN Simmons, female    DOB: Dec 13, 1990, 25 y.o.   MRN: [id number]  01/06/2016  Medication Refill (Adderall and Depo Provera)   HPI This 25 y.o. female presents for follow-up for ADD.  Doing well.  Has been maitained on current dose of Adderall for two years.  No side effects.  Completing work.  Working full-time; graduated two years ago.  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  Likes work.  Doing well at work.  Sleeping well.    Contraception:  Due for DepoProvera; restarted Depo provera in 07/2015.  No side effects to it.  No menses.    Migraines:  No better after last visit to discuss chronic daily headaches; no medication works well; almost mild daily headaches; almost mild treatable headaches with lots of caffeine; cannot tolerate medication.  Drinks 5 hour energy or [employer].     Review of Systems  Constitutional: Negative for chills, diaphoresis, fatigue and fever.  Eyes: Negative for visual disturbance.  Respiratory: Negative for cough and shortness of breath.   Cardiovascular: Negative for chest pain, palpitations and leg swelling.  Gastrointestinal: Negative for abdominal pain, constipation, diarrhea, nausea and vomiting.  Endocrine: Negative for cold intolerance, heat intolerance, polydipsia, polyphagia and polyuria.  Neurological: Positive for headaches. Negative for dizziness, tremors, seizures, syncope, facial asymmetry, speech difficulty, weakness, light-headedness and numbness.  Psychiatric/Behavioral: Positive for decreased concentration. Negative for dysphoric mood, self-injury, sleep disturbance and suicidal ideas. The patient is not nervous/anxious.     Past Medical History:  Diagnosis Date  . Attention deficit disorder   . Migraine   . Nephrolithiasis 02/2012   Past Surgical History:  Procedure Laterality Date  . WISDOM TOOTH EXTRACTION     No Known  Allergies Current Outpatient Prescriptions  Medication Sig Dispense Refill  . amphetamine-dextroamphetamine (ADDERALL) 30 MG tablet Take 1 tablet by mouth 3 (three) times daily. 90 tablet 0  . ondansetron (ZOFRAN ODT) 4 MG disintegrating tablet Take 1 tablet (4 mg total) by mouth every 8 (eight) hours as needed for nausea or vomiting. (Patient not taking: Reported on 01/06/2016) 10 tablet 0   No current facility-administered medications for this visit.    Social History   Social History  . Marital status: Single    Spouse name: n/a  . Number of children: 0  . Years of education: 70   Occupational History  . Account Representative    Social History Main Topics  . Smoking status: Never Smoker  . Smokeless tobacco: Never Used  . Alcohol use No  . Drug use: No  . Sexual activity: Yes    Birth control/ protection: Spermicide, Injection   Other Topics Concern  . Not on file   Social History Narrative   Lives with mother and mother's wife, and younger sister.   Right-handed.   2 cups caffeine daily.   Family History  Problem Relation Age of Onset  . Drug abuse Mother   . Migraines Mother   . Diabetes Father   . Obesity Father   . Hypertension Mother        Objective:    BP 112/84 (BP Location: Left Arm, Patient Position: Sitting, Cuff Size: Large)   Pulse (!) 113   Temp 98.4 F (36.9 C) (Oral)   Resp 16   Ht 5' 5.5" (1.664 m)   Wt 208 lb 9.6 oz (94.6 kg)   LMP 11/06/2015 (Approximate)   BMI  34.18 kg/m  Physical Exam  Constitutional: She is oriented to person, place, and time. She appears well-developed and well-nourished. No distress.  HENT:  Head: Normocephalic and atraumatic.  Right Ear: External ear normal.  Left Ear: External ear normal.  Nose: Nose normal.  Mouth/Throat: Oropharynx is clear and moist.  Eyes: Conjunctivae and EOM are normal. Pupils are equal, round, and reactive to light.  Neck: Normal range of motion. Neck supple. Carotid bruit is not  present. No thyromegaly present.  Cardiovascular: Normal rate, regular rhythm, normal heart sounds and intact distal pulses.  Exam reveals no gallop and no friction rub.   No murmur heard. Pulmonary/Chest: Effort normal and breath sounds normal. She has no wheezes. She has no rales.  Lymphadenopathy:    She has no cervical adenopathy.  Neurological: She is alert and oriented to person, place, and time. No cranial nerve deficit. She exhibits normal muscle tone. Coordination normal.  Skin: Skin is warm and dry. No rash noted. She is not diaphoretic. No erythema. No pallor.  Psychiatric: She has a normal mood and affect. Her behavior is normal. Judgment and thought content normal.        Assessment & Plan:   1. ADD (attention deficit disorder)   2. Chronic daily headache   3. Dysmenorrhea    -stable.  Refill of Adderall 30mg  tid provided for one month only as I am not her PCP.  Reviewed [facility] controlled substance policy with patient; one month supply provided; recommend follow-up with PCP/PA Leotis Shames in one month for ongoing follow-up.  Pt expressed understanding of current controlled substance policy. -chronic daily headaches uncontrolled;  current dose of Adderall likely contributing to daily headaches as headaches is one of most common side effects of medication and patient exceeds daily recommended dose of Adderall (usual maximum dose of 40mg  for ADHD and 60mg  for narcolepsy).  Recommend PCP and patient discussing current dose of Adderall at future appointment and/or considering a different medication that would be more effective at lower doses. -s/p DepoProvera in office.   No orders of the defined types were placed in this encounter.  Meds ordered this encounter  Medications  . amphetamine-dextroamphetamine (ADDERALL) 30 MG tablet    Sig: Take 1 tablet by mouth 3 (three) times daily.    Dispense:  90 tablet    Refill:  0  . medroxyPROGESTERone (DEPO-PROVERA) injection 152 mg     Return in about 4 weeks (around 02/03/2016) for recheck [employer].   Jamie Simmons Jamie Simmons, M.D. [facility] [address] [place], [place]  [address] [phone] phone [phone] fax

## 2016-01-06 NOTE — Patient Instructions (Addendum)
     IF you received an x-ray today, you will receive an invoice from [facility]. Please contact [facility] at [phone] with questions or concerns regarding your invoice.   IF you received labwork today, you will receive an invoice from [facility]. Please contact [facility] at [phone] with questions or concerns regarding your invoice.   Our billing staff will not be able to assist you with questions regarding bills from these companies.  You will be contacted with the lab results as soon as they are available. The fastest way to get your results is to activate your My Chart account. Instructions are located on the last page of this paperwork. If you have not heard from us regarding the results in 2 weeks, please contact this office.     [facility] Policy for Prescribing Controlled Substances (Revised 04/2012) 1. Prescriptions for controlled substances will be filled by ONE provider at [facility] with whom you have established and developed a plan for your care, including follow-up. 2. You are encouraged to schedule an appointment with your prescriber at our appointment center for follow-up visits whenever possible. 3. If you request a prescription for the controlled substance while at [facility] for an acute problem (with someone other than your regular prescriber), you MAY be given a ONE-TIME prescription for a 30-day supply of the controlled substance, to allow time for you to return to see your regular prescriber for additional prescriptions.  

## 2016-02-02 ENCOUNTER — Ambulatory Visit (INDEPENDENT_AMBULATORY_CARE_PROVIDER_SITE_OTHER): Payer: Commercial Managed Care - PPO | Admitting: Physician Assistant

## 2016-02-02 VITALS — BP 136/88 | HR 108 | Temp 98.5°F | Resp 16 | Ht 65.5 in | Wt 208.0 lb

## 2016-02-02 DIAGNOSIS — R519 Headache, unspecified: Secondary | ICD-10-CM

## 2016-02-02 DIAGNOSIS — F909 Attention-deficit hyperactivity disorder, unspecified type: Secondary | ICD-10-CM

## 2016-02-02 DIAGNOSIS — R51 Headache: Secondary | ICD-10-CM

## 2016-02-02 DIAGNOSIS — F988 Other specified behavioral and emotional disorders with onset usually occurring in childhood and adolescence: Secondary | ICD-10-CM

## 2016-02-02 MED ORDER — AMPHETAMINE-DEXTROAMPHETAMINE 30 MG PO TABS: 30.0000 mg | ORAL_TABLET | Freq: Three times a day (TID) | ORAL | 0 refills | Status: DC

## 2016-02-02 NOTE — Progress Notes (Signed)
Patient ID: Jamie Simmons, female    DOB: Aug 10, 1990, 25 y.o.   MRN: [id number]  PCP: Porfirio Oarhelle Neve Branscomb, PA-C  Subjective:   Chief Complaint  Patient presents with  . Medication Refill    adderall    HPI Presents for refill of Adderall.  Doing well in general. No adverse effects. Feels like the current dose is the right dose.  Has daily HA. Takes ibuprofen and caffeine with relief. Has seen neurology and is intolerant to all the prescriptions tried. Previously has been treated with prednisone to get off the daily NSAID, and has been able to keep the NSAID dose below the maximum daily dose since then.  Hopes to be moving out of her mother's house this fall. Has a new job since I saw her last. Her previous position was eliminated in a company down-sizing and her new position is more enjoyable, requires more responsibility and pays better.    Review of Systems  Constitutional: Negative.   HENT: Negative for sore throat.   Eyes: Negative for visual disturbance.  Respiratory: Negative for cough, chest tightness, shortness of breath and wheezing.   Cardiovascular: Negative for chest pain and palpitations.  Gastrointestinal: Negative for abdominal pain, diarrhea, nausea and vomiting.  Genitourinary: Negative for dysuria, frequency, hematuria and urgency.  Musculoskeletal: Negative for arthralgias and myalgias.  Skin: Negative for rash.  Neurological: Negative for dizziness, weakness and headaches.  Psychiatric/Behavioral: Negative for decreased concentration. The patient is not nervous/anxious.        Patient Active Problem List   Diagnosis Date Noted  . Chronic daily headache 09/17/2014  . Dysmenorrhea 12/28/2011  . ADD (attention deficit disorder) 09/29/2011     Prior to Admission medications   Medication Sig Start Date End Date Taking? Authorizing Provider  amphetamine-dextroamphetamine (ADDERALL) 30 MG tablet Take 1 tablet by mouth 3 (three) times daily. 01/06/16   Yes Ethelda ChickKristi M Smith, MD  ondansetron (ZOFRAN ODT) 4 MG disintegrating tablet Take 1 tablet (4 mg total) by mouth every 8 (eight) hours as needed for nausea or vomiting. Patient not taking: Reported on 01/06/2016 12/15/15   Shade FloodJeffrey R Greene, MD     No Known Allergies     Objective:  Physical Exam  Constitutional: She is oriented to person, place, and time. She appears well-developed and well-nourished. She is active and cooperative. No distress.  BP 136/88 (BP Location: Right Arm, Patient Position: Sitting, Cuff Size: Large)   Pulse (!) 108   Temp 98.5 F (36.9 C)   Resp 16   Ht 5' 5.5" (1.664 m)   Wt 208 lb (94.3 kg)   SpO2 99%   BMI 34.09 kg/m   HENT:  Head: Normocephalic and atraumatic.  Right Ear: Hearing normal.  Left Ear: Hearing normal.  Eyes: Conjunctivae are normal. No scleral icterus.  Neck: Normal range of motion. Neck supple. No thyromegaly present.  Cardiovascular: Normal rate, regular rhythm and normal heart sounds.   Pulses:      Radial pulses are 2+ on the right side, and 2+ on the left side.  Pulmonary/Chest: Effort normal and breath sounds normal.  Lymphadenopathy:       Head (right side): No tonsillar, no preauricular, no posterior auricular and no occipital adenopathy present.       Head (left side): No tonsillar, no preauricular, no posterior auricular and no occipital adenopathy present.    She has no cervical adenopathy.       Right: No supraclavicular adenopathy present.  Left: No supraclavicular adenopathy present.  Neurological: She is alert and oriented to person, place, and time. No sensory deficit.  Skin: Skin is warm, dry and intact. No rash noted. No cyanosis or erythema. Nails show no clubbing.  Psychiatric: She has a normal mood and affect. Her speech is normal and behavior is normal.           Assessment & Plan:   1. ADD (attention deficit disorder) Controlled. Stable. Continue current treatment. - amphetamine-dextroamphetamine  (ADDERALL) 30 MG tablet; Take 1 tablet by mouth 3 (three) times daily.  Dispense: 90 tablet; Refill: 0 - amphetamine-dextroamphetamine (ADDERALL) 30 MG tablet; Take 1 tablet by mouth 3 (three) times daily.  Dispense: 90 tablet; Refill: 0 - amphetamine-dextroamphetamine (ADDERALL) 30 MG tablet; Take 1 tablet by mouth 3 (three) times daily.  Dispense: 90 tablet; Refill: 0  2. Chronic daily headache Not interested in trying any daily suppressive therapy. Not interested in re-evaluation with neurology. Is committed to not escalating her current use of NSAIDS, but understands that even that is contributing to and possibly escalating her symptoms.    Return in about 6 months (around 08/04/2016).     Fernande Bras, PA-C Physician Assistant-Certified [facility]

## 2016-02-02 NOTE — Patient Instructions (Signed)
     IF you received an x-ray today, you will receive an invoice from [facility]. Please contact [facility] at [phone] with questions or concerns regarding your invoice.   IF you received labwork today, you will receive an invoice from [facility]. Please contact [facility] at [phone] with questions or concerns regarding your invoice.   Our billing staff will not be able to assist you with questions regarding bills from these companies.  You will be contacted with the lab results as soon as they are available. The fastest way to get your results is to activate your My Chart account. Instructions are located on the last page of this paperwork. If you have not heard from us regarding the results in 2 weeks, please contact this office.      

## 2016-02-10 ENCOUNTER — Ambulatory Visit: Payer: Commercial Managed Care - PPO | Admitting: Physician Assistant

## 2016-04-08 ENCOUNTER — Ambulatory Visit (INDEPENDENT_AMBULATORY_CARE_PROVIDER_SITE_OTHER): Payer: Commercial Managed Care - PPO | Admitting: Physician Assistant

## 2016-04-08 ENCOUNTER — Telehealth: Payer: Self-pay

## 2016-04-08 DIAGNOSIS — Z3042 Encounter for surveillance of injectable contraceptive: Secondary | ICD-10-CM

## 2016-04-08 DIAGNOSIS — F988 Other specified behavioral and emotional disorders with onset usually occurring in childhood and adolescence: Secondary | ICD-10-CM

## 2016-04-08 MED ORDER — MEDROXYPROGESTERONE ACETATE 150 MG/ML IM SUSP
150.0000 mg | Freq: Once | INTRAMUSCULAR | Status: AC
  Administered 2016-04-08: 150 mg via INTRAMUSCULAR

## 2016-04-08 NOTE — Telephone Encounter (Signed)
Patient is wanting to put in a request to get her adderral refilled. Patient states that it's not due yet. Please call when ready!  [phone]

## 2016-04-14 MED ORDER — AMPHETAMINE-DEXTROAMPHETAMINE 30 MG PO TABS: 30.0000 mg | ORAL_TABLET | Freq: Three times a day (TID) | ORAL | 0 refills | Status: DC

## 2016-04-14 NOTE — Telephone Encounter (Signed)
Meds ordered this encounter  Medications  . amphetamine-dextroamphetamine (ADDERALL) 30 MG tablet    Sig: Take 1 tablet by mouth 3 (three) times daily.    Dispense:  90 tablet    Refill:  0    Order Specific Question:   Supervising Provider    Answer:   SHAW, EVA N [[id number]]  . amphetamine-dextroamphetamine (ADDERALL) 30 MG tablet    Sig: Take 1 tablet by mouth 3 (three) times daily.    Dispense:  90 tablet    Refill:  0    May fill 30 days after date on prescription    Order Specific Question:   Supervising Provider    Answer:   Clelia CroftSHAW, EVA N [[id number]]  . amphetamine-dextroamphetamine (ADDERALL) 30 MG tablet    Sig: Take 1 tablet by mouth 3 (three) times daily.    Dispense:  90 tablet    Refill:  0    May fill 60 days after date on prescription    Order Specific Question:   Supervising Provider    Answer:   Clelia CroftSHAW, EVA N [[id number]]

## 2016-04-15 NOTE — Telephone Encounter (Signed)
LMOVM - patient's rx at front desk for pick up.

## 2016-04-27 ENCOUNTER — Telehealth: Payer: Self-pay

## 2016-04-27 NOTE — Telephone Encounter (Signed)
Pt is requesting a early refill on her adderall she is going out of town  [employer] [employer] [facility] [phone]

## 2016-04-28 NOTE — Telephone Encounter (Signed)
Mom calling to check on adderall request.  Patient will run out b/4 holidays.    Chelle

## 2016-05-06 NOTE — Telephone Encounter (Signed)
Per mom she has already goteen it filled, she will let us know if [facility] states she needs more.

## 2016-05-26 ENCOUNTER — Ambulatory Visit (INDEPENDENT_AMBULATORY_CARE_PROVIDER_SITE_OTHER): Payer: Commercial Managed Care - PPO | Admitting: Physician Assistant

## 2016-05-26 VITALS — BP 130/82 | HR 102 | Temp 98.8°F | Resp 18 | Ht 65.5 in | Wt 209.0 lb

## 2016-05-26 DIAGNOSIS — R21 Rash and other nonspecific skin eruption: Secondary | ICD-10-CM | POA: Diagnosis not present

## 2016-05-26 MED ORDER — TRIAMCINOLONE ACETONIDE 0.1 % EX CREA: 1.0000 "application " | TOPICAL_CREAM | Freq: Two times a day (BID) | CUTANEOUS | 0 refills | Status: DC

## 2016-05-26 MED ORDER — DOXYCYCLINE HYCLATE 100 MG PO TABS: 100.0000 mg | ORAL_TABLET | Freq: Two times a day (BID) | ORAL | 0 refills | Status: DC

## 2016-05-26 NOTE — Progress Notes (Signed)
Jamie Simmons  MRN: [id number] DOB: 01/15/1991  Subjective:  Pt presents to clinic with rash that has started after she was scratched by her dog 4 days ago.  Rash started yesterday afternoon with an itchy spot on her right breast that has gotten larger - there is no pain associated with the rash.  Then today she started to notice something similar on the medial aspect of her right breast and also in the same location on the left breast.  She has not changed bras and no change in laundry detergents, dryer sheets, lotion or soaps.  She has been on the internet looking at possible causes of this and not she has caused herself anxiety that she might have cancer.  Magnesium - has stopped the migraines - less headache days than headache days  Review of Systems  Constitutional: Negative for chills and fever.  Skin: Positive for rash.    Patient Active Problem List   Diagnosis Date Noted  . Chronic daily headache 09/17/2014  . Dysmenorrhea 12/28/2011  . ADD (attention deficit disorder) 09/29/2011    Current Outpatient Prescriptions on File Prior to Visit  Medication Sig Dispense Refill  . amphetamine-dextroamphetamine (ADDERALL) 30 MG tablet Take 1 tablet by mouth 3 (three) times daily. 90 tablet 0  . amphetamine-dextroamphetamine (ADDERALL) 30 MG tablet Take 1 tablet by mouth 3 (three) times daily. (Patient not taking: Reported on 05/26/2016) 90 tablet 0  . amphetamine-dextroamphetamine (ADDERALL) 30 MG tablet Take 1 tablet by mouth 3 (three) times daily. (Patient not taking: Reported on 05/26/2016) 90 tablet 0   No current facility-administered medications on file prior to visit.     No Known Allergies  Pt patients past, family and social history were reviewed and updated.   Objective:  BP 130/82   Pulse (!) 102   Temp 98.8 F (37.1 C) (Oral)   Resp 18   Ht 5' 5.5" (1.664 m)   Wt 209 lb (94.8 kg)   SpO2 100%   BMI 34.25 kg/m   Physical Exam  Constitutional: She is  oriented to person, place, and time and well-developed, well-nourished, and in no distress.  HENT:  Head: Normocephalic and atraumatic.  Right Ear: Hearing and external ear normal.  Left Ear: Hearing and external ear normal.  Eyes: Conjunctivae are normal.  Neck: Normal range of motion.  Pulmonary/Chest: Effort normal. Right breast exhibits no inverted nipple, no mass, no nipple discharge and no tenderness. Left breast exhibits no inverted nipple, no mass, no nipple discharge and no tenderness.    Lymphadenopathy:    She has no axillary adenopathy.  Neurological: She is alert and oriented to person, place, and time. Gait normal.  Skin: Skin is warm and dry.  Psychiatric: Mood, memory, affect and judgment normal.  Vitals reviewed.   Assessment and Plan :  Rash and nonspecific skin eruption - Plan: triamcinolone cream (KENALOG) 0.1 %, doxycycline (VIBRA-TABS) 100 MG tablet - unsure of cause of rash as it seems like it is becoming symmetrical on both breast though the outer right breast has the largest area of rash - there is no palpable lumps in the patients breast tissue.  We will treat for dry skin and possible early cellulitis though the symmetry makes that less likely. Pt is very anxious after her internet research but we talked about low likelihood due to quick onset and change - if she does not improve she will contact me and we will order mammogram of her breast.  She agrees  and understands the plan.  Benny LennertSarah Weber PA-C  [facility] 05/26/2016 7:39 PM

## 2016-05-26 NOTE — Patient Instructions (Signed)
     IF you received an x-ray today, you will receive an invoice from [facility]. Please contact [facility] at [phone] with questions or concerns regarding your invoice.   IF you received labwork today, you will receive an invoice from [facility]. Please contact [facility] at [phone] with questions or concerns regarding your invoice.   Our billing staff will not be able to assist you with questions regarding bills from these companies.  You will be contacted with the lab results as soon as they are available. The fastest way to get your results is to activate your My Chart account. Instructions are located on the last page of this paperwork. If you have not heard from us regarding the results in 2 weeks, please contact this office.     

## 2016-07-07 ENCOUNTER — Ambulatory Visit (INDEPENDENT_AMBULATORY_CARE_PROVIDER_SITE_OTHER): Payer: Commercial Managed Care - PPO | Admitting: Physician Assistant

## 2016-07-07 ENCOUNTER — Encounter: Payer: Self-pay | Admitting: Physician Assistant

## 2016-07-07 VITALS — BP 116/78 | HR 102 | Resp 16 | Wt 209.6 lb

## 2016-07-07 DIAGNOSIS — F988 Other specified behavioral and emotional disorders with onset usually occurring in childhood and adolescence: Secondary | ICD-10-CM | POA: Diagnosis not present

## 2016-07-07 DIAGNOSIS — N946 Dysmenorrhea, unspecified: Secondary | ICD-10-CM | POA: Diagnosis not present

## 2016-07-07 MED ORDER — AMPHETAMINE-DEXTROAMPHETAMINE 30 MG PO TABS: 30.0000 mg | ORAL_TABLET | Freq: Three times a day (TID) | ORAL | 0 refills | Status: DC

## 2016-07-07 MED ORDER — MEDROXYPROGESTERONE ACETATE 150 MG/ML IM SUSP: 150.0000 mg | Freq: Once | INTRAMUSCULAR | 0 refills | Status: DC

## 2016-07-07 MED ORDER — MEDROXYPROGESTERONE ACETATE 150 MG/ML IM SUSP
150.0000 mg | Freq: Once | INTRAMUSCULAR | Status: AC
  Administered 2016-07-07: 150 mg via INTRAMUSCULAR

## 2016-07-07 NOTE — Progress Notes (Signed)
     Patient ID: Jamie Simmons, female    DOB: 1990-12-14, 26 y.o.   MRN: [id number]  PCP: Porfirio Oarhelle Jamiesha Victoria, PA-C  Chief Complaint  Patient presents with  . Medication Refill    add med and depo today    Subjective:   Presents for medication refill and Depo-Provera injection.  Generally doing well, but notes that she's experiencing more anxiety lately. More anxiety attacks. "Can be crippling." Cares for great-great grandmother. Has been in a rehabilitation facility. Moving out of her mother's home in March. Poor sleep. "Freak out for no reason." "Sit and cry for an hour. And then I'm better. And then I go to sleep."  Not at all interested in anti-depressant/anti-anxiety treatment given her mother's history of polysubstance abuse. Thinks that moving out on her own will help a lot. No suicidal thoughts.   Review of Systems As above. No CP, SOB, HA, dizziness. No GI/GU symptoms.    Patient Active Problem List   Diagnosis Date Noted  . Chronic daily headache 09/17/2014  . Dysmenorrhea 12/28/2011  . ADD (attention deficit disorder) 09/29/2011     Prior to Admission medications   Medication Sig Start Date End Date Taking? Authorizing Provider  amphetamine-dextroamphetamine (ADDERALL) 30 MG tablet Take 1 tablet by mouth 3 (three) times daily. 04/14/16  Yes Maison Kestenbaum, PA-C  amphetamine-dextroamphetamine (ADDERALL) 30 MG tablet Take 1 tablet by mouth 3 (three) times daily. 04/14/16  Yes Jamarrion Budai, PA-C  amphetamine-dextroamphetamine (ADDERALL) 30 MG tablet Take 1 tablet by mouth 3 (three) times daily. 04/14/16  Yes Brette Cast, PA-C  magnesium gluconate (MAGONATE) 500 MG tablet Take 250 mg by mouth daily.   Yes Historical Provider, MD     No Known Allergies     Objective:  Physical Exam  Constitutional: She is oriented to person, place, and time. She appears well-developed and well-nourished. She is active and cooperative. No distress.  BP 116/78 (Cuff Size:  Large)   Pulse (!) 102   Resp 16   Wt 209 lb 9.6 oz (95.1 kg)   SpO2 100%   BMI 34.35 kg/m    Eyes: Conjunctivae are normal.  Pulmonary/Chest: Effort normal.  Neurological: She is alert and oriented to person, place, and time.  Psychiatric: She has a normal mood and affect. Her speech is normal and behavior is normal.           Assessment & Plan:   1. Dysmenorrhea Continue Depo-Provera Q3 months. - medroxyPROGESTERone (DEPO-PROVERA) injection 150 mg; Inject 1 mL (150 mg total) into the muscle once.  2. Attention deficit disorder (ADD) without hyperactivity Stable. Continue current treatment. - amphetamine-dextroamphetamine (ADDERALL) 30 MG tablet; Take 1 tablet by mouth 3 (three) times daily.  Dispense: 90 tablet; Refill: 0 - amphetamine-dextroamphetamine (ADDERALL) 30 MG tablet; Take 1 tablet by mouth 3 (three) times daily.  Dispense: 90 tablet; Refill: 0 - amphetamine-dextroamphetamine (ADDERALL) 30 MG tablet; Take 1 tablet by mouth 3 (three) times daily.  Dispense: 90 tablet; Refill: 0  3. Anxiety. Not interested in treatment now. Will let me know if that changes. Would use SSRI or amitriptyline or trazodone.  Re-evaluate 6 months, sooner if needed.  Fernande Brashelle S. Wright Gravely, PA-C Physician Assistant-Certified [facility]

## 2016-07-07 NOTE — Patient Instructions (Addendum)
Let me know if the anxiety and insomnia worsen and you'd like to consider medication to help.    IF you received an x-ray today, you will receive an invoice from [facility]. Please contact [facility] at [phone] with questions or concerns regarding your invoice.   IF you received labwork today, you will receive an [facility] from [facility]. Please contact [facility] at [phone] with questions or concerns regarding your invoice.   Our billing staff will not be able to assist you with questions regarding bills from these companies.  You will be contacted with the lab results as soon as they are available. The fastest way to get your results is to activate your My Chart account. Instructions are located on the last page of this paperwork. If you have not heard from us regarding the results in 2 weeks, please contact this office.

## 2016-08-18 NOTE — Progress Notes (Signed)
Depo injection only

## 2016-08-19 ENCOUNTER — Encounter: Payer: Self-pay | Admitting: Physician Assistant

## 2016-08-19 ENCOUNTER — Ambulatory Visit (INDEPENDENT_AMBULATORY_CARE_PROVIDER_SITE_OTHER): Payer: Commercial Managed Care - PPO | Admitting: Physician Assistant

## 2016-08-19 VITALS — BP 131/83 | HR 109 | Temp 99.1°F | Resp 18 | Ht 65.5 in | Wt 207.0 lb

## 2016-08-19 DIAGNOSIS — Z803 Family history of malignant neoplasm of breast: Secondary | ICD-10-CM | POA: Diagnosis not present

## 2016-08-19 DIAGNOSIS — F430 Acute stress reaction: Secondary | ICD-10-CM | POA: Diagnosis not present

## 2016-08-19 NOTE — Patient Instructions (Signed)
     IF you received an x-ray today, you will receive an invoice from [facility]. Please contact [facility] at [phone] with questions or concerns regarding your invoice.   IF you received labwork today, you will receive an invoice from [facility]. Please contact [facility] at [phone] with questions or concerns regarding your invoice.   Our billing staff will not be able to assist you with questions regarding bills from these companies.  You will be contacted with the lab results as soon as they are available. The fastest way to get your results is to activate your My Chart account. Instructions are located on the last page of this paperwork. If you have not heard from us regarding the results in 2 weeks, please contact this office.     

## 2016-08-19 NOTE — Progress Notes (Signed)
   Jamie Simmons  MRN: [id number] DOB: [birth year]  PCP: Porfirio [name], PA-C  Chief Complaint  Patient presents with  . Follow-up    from december appointment requesting Mammo    Subjective:  Pt presents to clinic still concerned about the rash that she had on her breast in December.  The rash resolved but she is still anxious that it might be something worse.  Recently her 2nd maternal cousin diagnosed with breast cancer at 25 y/o and her started with a rash which lead to a mammogram and her diagnosis.  She has had sore spots that are intermittent but she is very aware that she may be making that up.  Current she has an area that is healing from a pimple on her left upper breasts.  Review of Systems  Patient Active Problem List   Diagnosis Date Noted  . Chronic daily headache 09/17/2014  . Dysmenorrhea 12/28/2011  . ADD (attention deficit disorder) 09/29/2011    Current Outpatient Prescriptions on File Prior to Visit  Medication Sig Dispense Refill  . amphetamine-dextroamphetamine (ADDERALL) 30 MG tablet Take 1 tablet by mouth 3 (three) times daily. 90 tablet 0  . amphetamine-dextroamphetamine (ADDERALL) 30 MG tablet Take 1 tablet by mouth 3 (three) times daily. 90 tablet 0  . amphetamine-dextroamphetamine (ADDERALL) 30 MG tablet Take 1 tablet by mouth 3 (three) times daily. 90 tablet 0  . magnesium gluconate (MAGONATE) 500 MG tablet Take 250 mg by mouth daily.     No current facility-administered medications on file prior to visit.     No Known Allergies  Pt patients past, family and social history were reviewed and updated.   Objective:  BP 131/83   Pulse (!) 109   Temp 99.1 F (37.3 C) (Oral)   Resp 18   Ht 5' 5.5" (1.664 m)   Wt 207 lb (93.9 kg)   SpO2 97%   BMI 33.92 kg/m   Physical Exam  Constitutional: She is oriented to person, place, and time and well-developed, well-nourished, and in no distress.  HENT:  Head: Normocephalic and atraumatic.  Right  Ear: Hearing and external ear normal.  Left Ear: Hearing and external ear normal.  Eyes: Conjunctivae are normal.  Neck: Normal range of motion.  Pulmonary/Chest: Effort normal.  Neurological: She is alert and oriented to person, place, and time. Gait normal.  Skin: Skin is warm and dry.  Healing wound on left upper breast  Psychiatric: Mood, memory, affect and judgment normal.  Vitals reviewed.   Assessment and Plan :  Family history of breast cancer - Plan: MM Digital Screening  Stress reaction - Plan: MM Digital Screening   Pt is anxious about her rash that she had that might be linked to possible cancer and she knows that her anxiety is only going to stop if she has mammogram.  Benny LennertSarah [place]le[place] Lemaster PA-C  [facility] at [facility] 08/20/2016 1:09 PM

## 2016-09-21 ENCOUNTER — Encounter: Payer: Self-pay | Admitting: Physician Assistant

## 2016-09-21 DIAGNOSIS — N644 Mastodynia: Secondary | ICD-10-CM

## 2016-09-21 DIAGNOSIS — N63 Unspecified lump in unspecified breast: Secondary | ICD-10-CM

## 2016-09-23 ENCOUNTER — Telehealth: Payer: Self-pay | Admitting: Family Medicine

## 2016-09-23 ENCOUNTER — Ambulatory Visit: Payer: Commercial Managed Care - PPO

## 2016-09-23 DIAGNOSIS — F988 Other specified behavioral and emotional disorders with onset usually occurring in childhood and adolescence: Secondary | ICD-10-CM

## 2016-09-23 NOTE — Telephone Encounter (Signed)
CHELLE PT IS CALLING FOR A 3 MONTH REFILL ON ADDERALL

## 2016-09-24 MED ORDER — AMPHETAMINE-DEXTROAMPHETAMINE 30 MG PO TABS: 30.0000 mg | ORAL_TABLET | Freq: Three times a day (TID) | ORAL | 0 refills | Status: DC

## 2016-09-24 NOTE — Telephone Encounter (Signed)
06/2016 last refill 08/2016 last ov

## 2016-09-24 NOTE — Telephone Encounter (Signed)
Patient notified via My Chart.  Meds ordered this encounter  Medications  . amphetamine-dextroamphetamine (ADDERALL) 30 MG tablet    Sig: Take 1 tablet by mouth 3 (three) times daily.    Dispense:  90 tablet    Refill:  0    Order Specific Question:   Supervising Provider    Answer:   SHAW, EVA N [[id number]]  . amphetamine-dextroamphetamine (ADDERALL) 30 MG tablet    Sig: Take 1 tablet by mouth 3 (three) times daily.    Dispense:  90 tablet    Refill:  0    May fill 30 days after date on prescription    Order Specific Question:   Supervising Provider    Answer:   Clelia Croft, EVA N [[id number]]  . amphetamine-dextroamphetamine (ADDERALL) 30 MG tablet    Sig: Take 1 tablet by mouth 3 (three) times daily.    Dispense:  90 tablet    Refill:  0    May fill 60 days after date on prescription    Order Specific Question:   Supervising Provider    Answer:   Clelia Croft, EVA N [[id number]]

## 2016-09-25 NOTE — Telephone Encounter (Signed)
Up front 

## 2016-09-27 ENCOUNTER — Ambulatory Visit (INDEPENDENT_AMBULATORY_CARE_PROVIDER_SITE_OTHER): Payer: Commercial Managed Care - PPO | Admitting: Physician Assistant

## 2016-09-27 VITALS — BP 120/77 | HR 109 | Temp 98.9°F | Resp 16 | Ht 65.5 in | Wt 206.0 lb

## 2016-09-27 DIAGNOSIS — R519 Headache, unspecified: Secondary | ICD-10-CM

## 2016-09-27 DIAGNOSIS — F988 Other specified behavioral and emotional disorders with onset usually occurring in childhood and adolescence: Secondary | ICD-10-CM | POA: Diagnosis not present

## 2016-09-27 DIAGNOSIS — N946 Dysmenorrhea, unspecified: Secondary | ICD-10-CM | POA: Diagnosis not present

## 2016-09-27 DIAGNOSIS — R51 Headache: Secondary | ICD-10-CM

## 2016-09-27 DIAGNOSIS — Z30019 Encounter for initial prescription of contraceptives, unspecified: Secondary | ICD-10-CM | POA: Diagnosis not present

## 2016-09-27 MED ORDER — TOPIRAMATE 100 MG PO TABS: 100.0000 mg | ORAL_TABLET | Freq: Every day | ORAL | 1 refills | Status: DC

## 2016-09-27 MED ORDER — MEDROXYPROGESTERONE ACETATE 150 MG/ML IM SUSP
150.0000 mg | Freq: Once | INTRAMUSCULAR | Status: AC
  Administered 2016-09-27: 150 mg via INTRAMUSCULAR

## 2016-09-27 NOTE — Patient Instructions (Signed)
     IF you received an x-ray today, you will receive an invoice from [facility]. Please contact [facility] at [phone] with questions or concerns regarding your invoice.   IF you received labwork today, you will receive an invoice from [facility]. Please contact [facility] at [phone] with questions or concerns regarding your invoice.   Our billing staff will not be able to assist you with questions regarding bills from these companies.  You will be contacted with the lab results as soon as they are available. The fastest way to get your results is to activate your My Chart account. Instructions are located on the last page of this paperwork. If you have not heard from us regarding the results in 2 weeks, please contact this office.     

## 2016-09-27 NOTE — Progress Notes (Signed)
Patient ID: Jamie Simmons, female    DOB: 01/05/1991, 26 y.o.   MRN: [id number]  PCP: Porfirio Oar, PA-C  Chief Complaint  Patient presents with  . Headache  . Medication Refill    Adderall 30 mg  . Depo Provera injection    Subjective:   Presents for evaluation of continued headache, refill of Adderall, and Depo-Provera injection.  Won't be in the country in June when her Adderall prescription is due to be filled. Needs to be able to fill it early.  Headaches are daily again. Taking Advil every day. It usually helps at least some. All made her sick or like she needed to run laps. OTC migraine meds cause nausea, vomiting and jitteriness.  Has seen neurology, but did not tolerate the prescribed treatment. Unfortunately isn't sure what it was. Ultimately took a course of oral steroids to stop daily NSAID use, and managed to not resume the habit for quite some time.  Not interested in trazodone (her mother takes it and she knows it causes sleepiness), though she doesn't sleep well.  She currently lives with her mother and step-mother, but is planning to move out next month. There is a lot of family discord, likely stemming from her mother's history of substance abuse and the relationship difficulties with her mother's wife. A younger sister moved in with an aunt a number of years ago. The patient took responsibility for her mother's opiates and benzos for several years, but now her step-mother does that.  She has a good job, with considerable responsibilities. She speaks both [place] and [place]. Her travel 6/09-15 is to [place] for work.  Review of Systems As above.    Patient Active Problem List   Diagnosis Date Noted  . Chronic daily headache 09/17/2014  . Dysmenorrhea 12/28/2011  . ADD (attention deficit disorder) 09/29/2011     Prior to Admission medications   Medication Sig Start Date End Date Taking? Authorizing Provider  amphetamine-dextroamphetamine  (ADDERALL) 30 MG tablet Take 1 tablet by mouth 3 (three) times daily. 09/24/16  Yes Jerris Fleer, PA-C  amphetamine-dextroamphetamine (ADDERALL) 30 MG tablet Take 1 tablet by mouth 3 (three) times daily. 09/24/16  Yes Tieler Cournoyer, PA-C  amphetamine-dextroamphetamine (ADDERALL) 30 MG tablet Take 1 tablet by mouth 3 (three) times daily. 09/24/16  Yes Carra Brindley, PA-C  magnesium gluconate (MAGONATE) 500 MG tablet Take 250 mg by mouth daily.   Yes Historical Provider, MD     No Known Allergies     Objective:  Physical Exam  Constitutional: She is oriented to person, place, and time. She appears well-developed and well-nourished. She is active and cooperative. No distress.  BP 120/77   Pulse (!) 109   Temp 98.9 F (37.2 C) (Oral)   Resp 16   Ht 5' 5.5" (1.664 m)   Wt 206 lb (93.4 kg)   SpO2 99%   BMI 33.76 kg/m   HENT:  Head: Normocephalic and atraumatic.  Right Ear: Hearing normal.  Left Ear: Hearing normal.  Eyes: Conjunctivae are normal. No scleral icterus.  Neck: Normal range of motion. Neck supple. No thyromegaly present.  Cardiovascular: Normal rate, regular rhythm and normal heart sounds.   Pulses:      Radial pulses are 2+ on the right side, and 2+ on the left side.  Pulmonary/Chest: Effort normal and breath sounds normal.  Lymphadenopathy:       Head (right side): No tonsillar, no preauricular, no posterior auricular and no occipital adenopathy present.  Head (left side): No tonsillar, no preauricular, no posterior auricular and no occipital adenopathy present.    She has no cervical adenopathy.       Right: No supraclavicular adenopathy present.       Left: No supraclavicular adenopathy present.  Neurological: She is alert and oriented to person, place, and time. No sensory deficit.  Skin: Skin is warm, dry and intact. No rash noted. No cyanosis or erythema. Nails show no clubbing.  Psychiatric: Her speech is normal and behavior is normal. Judgment and thought  content normal. Her mood appears not anxious. Her affect is blunt. Her affect is not angry, not labile and not inappropriate. Cognition and memory are normal. She does not exhibit a depressed mood.           Assessment & Plan:   Problem List Items Addressed This Visit    ADD (attention deficit disorder) (Chronic)    Controlled. OK to fill June Rx early, as she will be out of the country.      Dysmenorrhea (Chronic)    Continue Depo-Provera Q12 weeks.      Chronic daily headache    Discussed options for treatment. Trial of topiramate. Reduced stress at home will likely help.      Relevant Medications   topiramate (TOPAMAX) 100 MG tablet    Other Visit Diagnoses    Encounter for female birth control    -  Primary   Relevant Medications   medroxyPROGESTERone (DEPO-PROVERA) injection 150 mg (Completed)       Return in about 3 months (around 12/27/2016), or if symptoms worsen or fail to improve, for re-evaluation of headache and insomnia.   Fernande Bras, PA-C Primary Care at [facility]

## 2016-10-02 NOTE — Assessment & Plan Note (Signed)
Continue Depo-Provera Q12 weeks.

## 2016-10-02 NOTE — Assessment & Plan Note (Signed)
Discussed options for treatment. Trial of topiramate. Reduced stress at home will likely help.

## 2016-10-02 NOTE — Assessment & Plan Note (Addendum)
Controlled. OK to fill June Rx early, as she will be out of the country.

## 2016-11-09 ENCOUNTER — Telehealth: Payer: Self-pay | Admitting: Family Medicine

## 2016-11-09 ENCOUNTER — Encounter: Payer: Self-pay | Admitting: Physician Assistant

## 2016-11-09 NOTE — Telephone Encounter (Signed)
Will you write?

## 2016-11-09 NOTE — Telephone Encounter (Signed)
Chelle pt calling wanting a note for customs she is going to [place] on Friday and need a letter stating that you are the provider that prescribes the medicine Adderall for pt and the name of medicine and what its used for please call pt when ready for pick up

## 2016-11-10 NOTE — Telephone Encounter (Signed)
Left message letter is ready for pick up

## 2016-11-10 NOTE — Telephone Encounter (Signed)
Letter printed. Will sign today.

## 2016-12-07 ENCOUNTER — Telehealth: Payer: Self-pay | Admitting: Physician Assistant

## 2016-12-07 NOTE — Telephone Encounter (Signed)
Pt is needing to know when next depo shot is due   Best number [phone]

## 2016-12-07 NOTE — Telephone Encounter (Signed)
Left message with next depo shot schedule of July 9-July 23rd

## 2016-12-10 ENCOUNTER — Encounter: Payer: Self-pay | Admitting: Physician Assistant

## 2016-12-10 ENCOUNTER — Telehealth: Payer: Self-pay | Admitting: Family Medicine

## 2016-12-10 NOTE — Telephone Encounter (Signed)
PATIENT CALLING TO LET CHELLE KNOW THAT SHE [facility] [address] [facility]USE [facility] [address] [facility]T [facility] EN[facility]ILL[facility] FOR A [facility]PLY [facility] [address]  WOULD LIKE FOR YOU TO GIVE THEM A CALL TO VERBALLY SAY ITS OK TO FILL [phone]

## 2016-12-10 NOTE — Telephone Encounter (Signed)
Authorized the fill at her usual pharmacy. She plans to use a discount card today, and will need PA for future fills. The pharmacy will send us the form to complete.

## 2016-12-22 ENCOUNTER — Ambulatory Visit (INDEPENDENT_AMBULATORY_CARE_PROVIDER_SITE_OTHER): Payer: Commercial Managed Care - PPO | Admitting: Physician Assistant

## 2016-12-22 ENCOUNTER — Encounter: Payer: Self-pay | Admitting: Physician Assistant

## 2016-12-22 VITALS — BP 123/84 | HR 118 | Temp 98.7°F | Resp 16 | Ht 65.5 in | Wt 198.6 lb

## 2016-12-22 DIAGNOSIS — F988 Other specified behavioral and emotional disorders with onset usually occurring in childhood and adolescence: Secondary | ICD-10-CM

## 2016-12-22 DIAGNOSIS — R51 Headache: Secondary | ICD-10-CM

## 2016-12-22 DIAGNOSIS — R519 Headache, unspecified: Secondary | ICD-10-CM

## 2016-12-22 DIAGNOSIS — N946 Dysmenorrhea, unspecified: Secondary | ICD-10-CM

## 2016-12-22 DIAGNOSIS — Z30019 Encounter for initial prescription of contraceptives, unspecified: Secondary | ICD-10-CM

## 2016-12-22 MED ORDER — MEDROXYPROGESTERONE ACETATE 150 MG/ML IM SUSP
150.0000 mg | INTRAMUSCULAR | Status: AC
  Administered 2016-12-22 – 2017-11-16 (×4): 150 mg via INTRAMUSCULAR

## 2016-12-22 MED ORDER — AMPHETAMINE-DEXTROAMPHETAMINE 30 MG PO TABS: 30.0000 mg | ORAL_TABLET | Freq: Three times a day (TID) | ORAL | 0 refills | Status: DC

## 2016-12-22 NOTE — Progress Notes (Signed)
Subjective:    Patient ID: Jamie Simmons, female    DOB: 12-24-1990, 26 y.o.   MRN: [id number] PCP: Porfirio OarJeffery, Chelle, PA-C   HPI Jamie Simmons is a 26 year old female presenting for a medication refill and depo-provera injection.  Generally feeling well. No concerns. Feels Adderall is at the right dose. Without the medication, feels "busy" but not productive.  Ready for another depo provera injection today. Amenorrheic and happy with this.  Daily headaches continue. Not currently taking topamax because she was unable to take it to [place] (she recently returned from a work trip). She was taking it for a month before she left and is unsure if the prescription at the pharmacy is still valid. If not, she would like a refill.   Patient Active Problem List   Diagnosis Date Noted  . Chronic daily headache 09/17/2014  . Dysmenorrhea 12/28/2011  . ADD (attention deficit disorder) 09/29/2011   Prior to Admission medications   Medication Sig Start Date End Date Taking? Authorizing Provider  amphetamine-dextroamphetamine (ADDERALL) 30 MG tablet Take 1 tablet by mouth 3 (three) times daily. 09/24/16   Jeffery, Chelle, PA-C  amphetamine-dextroamphetamine (ADDERALL) 30 MG tablet Take 1 tablet by mouth 3 (three) times daily. 09/24/16   Jeffery, Chelle, PA-C  amphetamine-dextroamphetamine (ADDERALL) 30 MG tablet Take 1 tablet by mouth 3 (three) times daily. 09/24/16   Porfirio OarJeffery, Chelle, PA-C  magnesium gluconate (MAGONATE) 500 MG tablet Take 250 mg by mouth daily.    [provider]  topiramate (TOPAMAX) 100 MG tablet Take 1 tablet (100 mg total) by mouth at bedtime. 09/27/16   Porfirio OarJeffery, Chelle, PA-C   No Known Allergies  Review of Systems  Constitutional: Negative for activity change, chills and fever.  HENT: Negative for sinus pressure, sneezing and sore throat.   Eyes: Negative for visual disturbance.  Respiratory: Negative for cough, chest tightness and shortness of breath.     Cardiovascular: Negative for chest pain.  Gastrointestinal: Negative for abdominal distention, constipation, diarrhea, nausea and vomiting.  Genitourinary: Negative for difficulty urinating and hematuria.  Musculoskeletal: Negative for arthralgias and myalgias.  Skin: Negative for rash.  Neurological: Positive for headaches. Negative for dizziness.       Objective:   Physical Exam  Constitutional: She appears well-developed and well-nourished.  BP 123/84 (BP Location: Right Arm, Patient Position: Sitting, Cuff Size: Large)   Pulse (!) 118   Temp 98.7 F (37.1 C) (Oral)   Resp 16   Ht 5' 5.5" (1.664 m)   Wt 198 lb 9.6 oz (90.1 kg)   SpO2 100%   BMI 32.55 kg/m    HENT:  Head: Normocephalic and atraumatic.  Right Ear: External ear normal.  Left Ear: External ear normal.  Nose: Nose normal.  Eyes: EOM are normal.  Neck: Normal range of motion. Neck supple.  Cardiovascular: Normal rate, regular rhythm, normal heart sounds and intact distal pulses.   Pulses:      Radial pulses are 2+ on the right side, and 2+ on the left side.  Pulmonary/Chest: Effort normal and breath sounds normal.  Lymphadenopathy:    She has no cervical adenopathy.  Neurological: She is alert.  Skin: Skin is warm.  Psychiatric: She has a normal mood and affect. Her behavior is normal.      Assessment & Plan:   1. Attention deficit disorder (ADD) without hyperactivity Stable. RF given. - amphetamine-dextroamphetamine (ADDERALL) 30 MG tablet; Take 1 tablet by mouth 3 (three) times daily.  Dispense: 90 tablet; Refill:  0 - amphetamine-dextroamphetamine (ADDERALL) 30 MG tablet; Take 1 tablet by mouth 3 (three) times daily.  Dispense: 90 tablet; Refill: 0 - amphetamine-dextroamphetamine (ADDERALL) 30 MG tablet; Take 1 tablet by mouth 3 (three) times daily.  Dispense: 90 tablet; Refill: 0  2. Encounter for female birth control Injection administered today. Return in 3 months. - medroxyPROGESTERone  (DEPO-PROVERA) injection 150 mg; Inject 1 mL (150 mg total) into the muscle every 3 (three) months.  3. Chronic daily headache RF present at pharmacy.    Respectfully, Allie Dimmer, PA-S

## 2016-12-22 NOTE — Patient Instructions (Signed)
     IF you received an x-ray today, you will receive an invoice from [facility]. Please contact [facility] at [phone] with questions or concerns regarding your invoice.   IF you received labwork today, you will receive an invoice from [facility]. Please contact [facility] at [phone] with questions or concerns regarding your invoice.   Our billing staff will not be able to assist you with questions regarding bills from these companies.  You will be contacted with the lab results as soon as they are available. The fastest way to get your results is to activate your My Chart account. Instructions are located on the last page of this paperwork. If you have not heard from us regarding the results in 2 weeks, please contact this office.     

## 2016-12-22 NOTE — Progress Notes (Signed)
Patient ID: Jamie Simmons, female    DOB: 1991/01/05, 26 y.o.   MRN: [id number]  PCP: Porfirio OarJeffery, Almer L[place]leton, PA-C  Chief Complaint  Patient presents with  . Medication Refill    Adderall 30mg  and Topamax 100mg   . Contraception    patient would like depo injection    Subjective:   Presents for medication refills.  Overall, feels well. Adderall at the current dose is working well, not causing adverse effects. No palpitations, sleep disruption.  Continues to experience daily headache. Did take topiramate for about 1 month. Isn't sure whether or not it worked. Colleagues commented that she was mean, but she thinks that just her, not a medicaiton effect. She stopped it while she was recently in [place] for work, because she read that she wasn't allowed to take it with her there. She plans to resume it now that she is back.  Remains pleased with current contraception and management of dysmenorrhea..   Review of Systems  Constitutional: Negative.   HENT: Negative for sore throat.   Eyes: Negative for visual disturbance.  Respiratory: Negative for cough, chest tightness, shortness of breath and wheezing.   Cardiovascular: Negative for chest pain and palpitations.  Gastrointestinal: Negative for abdominal pain, diarrhea, nausea and vomiting.  Genitourinary: Negative for dysuria, frequency, hematuria and urgency.  Musculoskeletal: Negative for arthralgias and myalgias.  Skin: Negative for rash.  Neurological: Positive for headaches. Negative for dizziness and weakness.  Psychiatric/Behavioral: Negative for decreased concentration. The patient is not nervous/anxious.        Patient Active Problem List   Diagnosis Date Noted  . Chronic daily headache 09/17/2014  . Dysmenorrhea 12/28/2011  . ADD (attention deficit disorder) 09/29/2011     Prior to Admission medications   Medication Sig Start Date End Date Taking? Authorizing Provider  amphetamine-dextroamphetamine  (ADDERALL) 30 MG tablet Take 1 tablet by mouth 3 (three) times daily. 09/24/16  Yes Ambria Mayfield, PA-C  amphetamine-dextroamphetamine (ADDERALL) 30 MG tablet Take 1 tablet by mouth 3 (three) times daily. 09/24/16  Yes Shamere Dilworth, PA-C  amphetamine-dextroamphetamine (ADDERALL) 30 MG tablet Take 1 tablet by mouth 3 (three) times daily. 09/24/16  Yes Roberto Hlavaty, PA-C  magnesium gluconate (MAGONATE) 500 MG tablet Take 250 mg by mouth daily.   Yes [provider]  topiramate (TOPAMAX) 100 MG tablet Take 1 tablet (100 mg total) by mouth at bedtime. 09/27/16  Yes Novalie Leamy, PA-C     No Known Allergies     Objective:  Physical Exam  Constitutional: She is oriented to person, place, and time. She appears well-developed and well-nourished. She is active and cooperative. No distress.  BP 123/84 (BP Location: Right Arm, Patient Position: Sitting, Cuff Size: Large)   Pulse (!) 118   Temp 98.7 F (37.1 C) (Oral)   Resp 16   Ht 5' 5.5" (1.664 m)   Wt 198 lb 9.6 oz (90.1 kg)   SpO2 100%   BMI 32.55 kg/m   HENT:  Head: Normocephalic and atraumatic.  Right Ear: Hearing normal.  Left Ear: Hearing normal.  Eyes: Conjunctivae are normal. No scleral icterus.  Neck: Normal range of motion. Neck supple. No thyromegaly present.  Cardiovascular: Normal rate, regular rhythm and normal heart sounds.   Pulses:      Radial pulses are 2+ on the right side, and 2+ on the left side.  Pulmonary/Chest: Effort normal and breath sounds normal.  Lymphadenopathy:       Head (right side): No tonsillar, no  preauricular, no posterior auricular and no occipital adenopathy present.       Head (left side): No tonsillar, no preauricular, no posterior auricular and no occipital adenopathy present.    She has no cervical adenopathy.       Right: No supraclavicular adenopathy present.       Left: No supraclavicular adenopathy present.  Neurological: She is alert and oriented to person, place, and  time. No sensory deficit.  Skin: Skin is warm, dry and intact. No rash noted. No cyanosis or erythema. Nails show no clubbing.  Psychiatric: She has a normal mood and affect. Her speech is normal and behavior is normal.           Assessment & Plan:   Problem List Items Addressed This Visit    ADD (attention deficit disorder) - Primary (Chronic)   Relevant Medications   amphetamine-dextroamphetamine (ADDERALL) 30 MG tablet   amphetamine-dextroamphetamine (ADDERALL) 30 MG tablet   amphetamine-dextroamphetamine (ADDERALL) 30 MG tablet   Dysmenorrhea (Chronic)   Relevant Medications   medroxyPROGESTERone (DEPO-PROVERA) injection 150 mg   Chronic daily headache    Other Visit Diagnoses    Encounter for female birth control       Relevant Medications   medroxyPROGESTERone (DEPO-PROVERA) injection 150 mg       Return in about 3 months (around 03/24/2017) for medication refill and depo-provera injection, 6 months for mood.   Fernande Bras, PA-C Primary Care at [facility]

## 2016-12-23 ENCOUNTER — Ambulatory Visit: Payer: Commercial Managed Care - PPO | Admitting: Physician Assistant

## 2016-12-26 NOTE — Assessment & Plan Note (Signed)
Resume topiramate. Advise me of benefit/adverse effects.

## 2016-12-26 NOTE — Assessment & Plan Note (Signed)
Stable

## 2016-12-26 NOTE — Assessment & Plan Note (Signed)
Stable  controlled

## 2017-01-03 ENCOUNTER — Encounter: Payer: Self-pay | Admitting: Physician Assistant

## 2017-01-04 ENCOUNTER — Telehealth: Payer: Self-pay

## 2017-01-04 NOTE — Telephone Encounter (Signed)
PA form for amphetamine-dextroamphetamine 30 mg placed in box please see highlighted items.

## 2017-01-04 NOTE — Telephone Encounter (Signed)
Please search for these forms.

## 2017-01-05 NOTE — Telephone Encounter (Signed)
Form completed and returned to Prior Authorization team.

## 2017-01-06 NOTE — Telephone Encounter (Signed)
Jamie Simmons,  This patient's insurance won't cover TID IR Adderall anymore. She's tried Adderall XR before, but it didn't last long enough, and she still needed a PM dose of IR, which meant 2 co-pays.  I'd like to try Vyvanse with her. Would you please kindly authorize the Rx I've pended?

## 2017-01-07 MED ORDER — LISDEXAMFETAMINE DIMESYLATE 30 MG PO CAPS: 30.0000 mg | ORAL_CAPSULE | Freq: Every day | ORAL | 0 refills | Status: DC

## 2017-01-07 NOTE — Telephone Encounter (Signed)
Done.  Pt knows through mychart 

## 2017-01-11 ENCOUNTER — Telehealth: Payer: Self-pay

## 2017-01-11 NOTE — Telephone Encounter (Signed)
Per Jamie LennertSarah Weber, PA-C, tried to call patient to advise that [facility] has denied claim for #3 tabs per day of adderall 30 mg and they recommend patient taking #2 per day.   Jamie SagoSarah wants to advise patient to discuss this with Jamie [name], PA-C at next visit. I tried to call patient, however, their VM was full. Original fax sent to scan.

## 2017-01-17 ENCOUNTER — Encounter: Payer: Self-pay | Admitting: Physician Assistant

## 2017-01-24 ENCOUNTER — Encounter: Payer: Self-pay | Admitting: Physician Assistant

## 2017-01-24 ENCOUNTER — Ambulatory Visit (INDEPENDENT_AMBULATORY_CARE_PROVIDER_SITE_OTHER): Payer: Commercial Managed Care - PPO | Admitting: Physician Assistant

## 2017-01-24 VITALS — BP 112/82 | HR 114 | Temp 98.2°F | Resp 18 | Ht 65.5 in | Wt 195.0 lb

## 2017-01-24 DIAGNOSIS — F988 Other specified behavioral and emotional disorders with onset usually occurring in childhood and adolescence: Secondary | ICD-10-CM

## 2017-01-24 MED ORDER — AMPHET-DEXTROAMPHET 3-BEAD ER 37.5 MG PO CP24: 37.5000 mg | ORAL_CAPSULE | Freq: Every day | ORAL | 0 refills | Status: DC

## 2017-01-24 NOTE — Progress Notes (Signed)
Jamie Simmons  MRN: [id number] DOB: May 26, 1991  PCP: Porfirio Oar, PA-C  Chief Complaint  Patient presents with  . Medication Problem    would like to discuss an extened release medication she had a couple years ago and believes it was 90mg      Subjective:  Pt presents to clinic for discussion of her recent ADD medication change.  She is very nervous to go from 90mg  of medication to 30mg  and she does not understand why that was done and she is concerned that she will need more.  Her work days are typically at least 10 hours long - when she is out of town for work she does note that she only needs bid dosing because her work days are closer to 8 hours long and she does not have work at home that she has to do.   Adderall 30mg  tid works well for her long days but her insurance will not cover and the cost is becoming prohibitive.   02/27/2014 - Adderall XR 30 - made her headaches worse  History is obtained by patient.  Review of Systems  Psychiatric/Behavioral: Positive for decreased concentration. The patient is nervous/anxious.     Patient Active Problem List   Diagnosis Date Noted  . Chronic daily headache 09/17/2014  . Dysmenorrhea 12/28/2011  . ADD (attention deficit disorder) 09/29/2011    Current Outpatient Prescriptions on File Prior to Visit  Medication Sig Dispense Refill  . amphetamine-dextroamphetamine (ADDERALL) 30 MG tablet Take 1 tablet by mouth 3 (three) times daily. 90 tablet 0  . amphetamine-dextroamphetamine (ADDERALL) 30 MG tablet Take 1 tablet by mouth 3 (three) times daily. 90 tablet 0  . amphetamine-dextroamphetamine (ADDERALL) 30 MG tablet Take 1 tablet by mouth 3 (three) times daily. 90 tablet 0  . topiramate (TOPAMAX) 100 MG tablet Take 1 tablet (100 mg total) by mouth at bedtime. 90 tablet 1  . lisdexamfetamine (VYVANSE) 30 MG capsule Take 1 capsule (30 mg total) by mouth daily. (Patient not taking: Reported on 01/24/2017) 30 capsule 0  .  magnesium gluconate (MAGONATE) 500 MG tablet Take 250 mg by mouth daily.     Current Facility-Administered Medications on File Prior to Visit  Medication Dose Route Frequency Provider Last Rate Last Dose  . medroxyPROGESTERone (DEPO-PROVERA) injection 150 mg  150 mg Intramuscular Q90 days Porfirio Oar, PA-C   150 mg at 12/22/16 1731    No Known Allergies  Past Medical History:  Diagnosis Date  . Attention deficit disorder   . Migraine   . Nephrolithiasis 02/2012   Social History   Social History Narrative   Lives with mother and mother's wife. Younger sister lives with an aunt.   Right-handed.   2 cups caffeine daily.   Speaks [place].   Social History  Substance Use Topics  . Smoking status: Never Smoker  . Smokeless tobacco: Never Used  . Alcohol use No   family history includes Diabetes in her father; Drug abuse in her mother; Hypertension in her mother; Migraines in her mother; Obesity in her father.     Objective:  BP 112/82   Pulse (!) 114   Temp 98.2 F (36.8 C) (Oral)   Resp 18   Ht 5' 5.5" (1.664 m)   Wt 195 lb (88.5 kg)   SpO2 98%   BMI 31.96 kg/m  Body mass index is 31.96 kg/m.  Physical Exam  Constitutional: She is oriented to person, place, and time and well-developed, well-nourished, and in no distress.  HENT:  Head: Normocephalic and atraumatic.  Right Ear: Hearing and external ear normal.  Left Ear: Hearing and external ear normal.  Eyes: Conjunctivae are normal.  Neck: Normal range of motion.  Pulmonary/Chest: Effort normal.  Neurological: She is alert and oriented to person, place, and time. Gait normal.  Skin: Skin is warm and dry.  Psychiatric: Mood, memory, affect and judgment normal.  Vitals reviewed.   Assessment and Plan :  Attention deficit disorder (ADD) without hyperactivity - Plan: Amphet-Dextroamphet 3-Bead ER (MYDAYIS) 37.5 MG CP24   Pt has good control with Adderall IR but her dosing is to frequent during the day for  insurance coverage - we will change to same active ingredient in a longer acting medication - she was given a coupon to help with this cost - d/w pt we may have to do a prior authorization on this medication but she is most comfortable keeping with the same active medication.  She will communicate it effectiveness through mychart and recheck in month if a lot of problems.  I did not put her on the highest dose available though she may need it because she will not have peaks and troughs like with adderall IR so she may have imporved overall focus throughout the day.  Did discuss that the dose can be increased but suspect that the patient will be fine on this dose.   Benny Lennert PA-C  Primary Care at [facility] 01/24/2017 5:24 PM

## 2017-01-24 NOTE — Patient Instructions (Signed)
     IF you received an x-ray today, you will receive an invoice from [facility]. Please contact [facility] at [phone] with questions or concerns regarding your invoice.   IF you received labwork today, you will receive an invoice from [facility]. Please contact [facility] at [phone] with questions or concerns regarding your invoice.   Our billing staff will not be able to assist you with questions regarding bills from these companies.  You will be contacted with the lab results as soon as they are available. The fastest way to get your results is to activate your My Chart account. Instructions are located on the last page of this paperwork. If you have not heard from us regarding the results in 2 weeks, please contact this office.     

## 2017-01-26 ENCOUNTER — Encounter: Payer: Self-pay | Admitting: Physician Assistant

## 2017-01-26 ENCOUNTER — Telehealth: Payer: Self-pay

## 2017-01-26 NOTE — Telephone Encounter (Signed)
Jamie Simmons, I received a DENIAL today from [employer] on Mydayis.  On PA Form I indicated that patient has tried both Adderall and Adderall XR, however, they only count that as one trial.  They want patient to try one more medication before they will approve the Mydayis.  The formulary alternatives are listed below: Methylphenidate IR or ER Vyvanse Vyvanse chewables After a trial of one of these, Mydayis should be approved. Thanks, [employer]

## 2017-01-26 NOTE — Telephone Encounter (Signed)
Noted - thanks - I will communicate with the patient.

## 2017-02-02 ENCOUNTER — Telehealth: Payer: Self-pay

## 2017-02-02 MED ORDER — LISDEXAMFETAMINE DIMESYLATE 50 MG PO CAPS: 50.0000 mg | ORAL_CAPSULE | Freq: Every day | ORAL | 0 refills | Status: DC

## 2017-02-02 NOTE — Telephone Encounter (Signed)
Will be over to sign - pt knows by mychart  That she needs to pick it up

## 2017-02-21 ENCOUNTER — Encounter: Payer: Self-pay | Admitting: Physician Assistant

## 2017-02-21 DIAGNOSIS — F988 Other specified behavioral and emotional disorders with onset usually occurring in childhood and adolescence: Secondary | ICD-10-CM

## 2017-02-22 MED ORDER — AMPHET-DEXTROAMPHET 3-BEAD ER 50 MG PO CP24: 50.0000 mg | ORAL_CAPSULE | Freq: Every day | ORAL | 0 refills | Status: DC

## 2017-02-22 NOTE — Telephone Encounter (Signed)
Pt knws that it is ready to pick up

## 2017-03-16 ENCOUNTER — Encounter: Payer: Self-pay | Admitting: Physician Assistant

## 2017-03-16 ENCOUNTER — Ambulatory Visit (INDEPENDENT_AMBULATORY_CARE_PROVIDER_SITE_OTHER): Payer: PRIVATE HEALTH INSURANCE | Admitting: Physician Assistant

## 2017-03-16 VITALS — BP 132/84 | HR 126 | Temp 98.6°F | Resp 18 | Ht 65.5 in | Wt 196.8 lb

## 2017-03-16 DIAGNOSIS — N946 Dysmenorrhea, unspecified: Secondary | ICD-10-CM

## 2017-03-16 DIAGNOSIS — R51 Headache: Secondary | ICD-10-CM | POA: Diagnosis not present

## 2017-03-16 DIAGNOSIS — Z30019 Encounter for initial prescription of contraceptives, unspecified: Secondary | ICD-10-CM | POA: Diagnosis not present

## 2017-03-16 DIAGNOSIS — F988 Other specified behavioral and emotional disorders with onset usually occurring in childhood and adolescence: Secondary | ICD-10-CM

## 2017-03-16 DIAGNOSIS — R519 Headache, unspecified: Secondary | ICD-10-CM

## 2017-03-16 MED ORDER — AMPHETAMINE-DEXTROAMPHETAMINE 30 MG PO TABS: 30.0000 mg | ORAL_TABLET | Freq: Three times a day (TID) | ORAL | 0 refills | Status: DC

## 2017-03-16 MED ORDER — TOPIRAMATE 100 MG PO TABS: 100.0000 mg | ORAL_TABLET | Freq: Every day | ORAL | 1 refills | Status: DC

## 2017-03-16 NOTE — Patient Instructions (Addendum)
When you change insurance plans (again!), check to see what stimulant medications are covered and let me know! Ask for prescription drug formulary of the insurance company.  Since you never tried Vyvanse, that could be an option in the future. If insurance plans are not cooperative with medication, we could consider referring you to [facility], who may be able to provide other options for you.   IF you received an x-ray today, you will receive an invoice from [facility]. Please contact [facility] at [phone] with questions or concerns regarding your invoice.   IF you received labwork today, you will receive an invoice from [place]. Please contact [facility] at [phone] with questions or concerns regarding your invoice.   Our billing staff will not be able to assist you with questions regarding bills from these companies.  You will be contacted with the lab results as soon as they are available. The fastest way to get your results is to activate your My Chart account. Instructions are located on the last page of this paperwork. If you have not heard from [place] regarding the results in 2 weeks, please contact this office.

## 2017-03-16 NOTE — Progress Notes (Signed)
Patient ID: Jamie Simmons, female    DOB: [birth year], 26 y.o.   MRN: [id number]  PCP: Porfirio Oar, PA-C  Chief Complaint  Patient presents with  . Medication Refill    Topamax 100 MG, Adderall or Mydayis  . Depo Shot    Subjective:   Presents for evaluation of ADD, migraine headache and needing prescription refills.  We've recently be changing her ADD treatment, because her insurance plan changed and didn't cover the TID immediate release regimen that was previously very effective for her. She never filled the Rx for Vyvanse due to $80 co-pay.  Most recently, we INCREASED Mydais from 37.5 mg to 50 mg to try to achieve improved symptom control, but she reports that it "does not feel effective." On this product, she experiences more difficulty with focus and inattention, so she's less productive, and increased hyperactivity, which feels really uncomfortable.   Her current plan now covers the immediate release Adderall, and she'd like to resume that, given that it worked and she toelrated it well.   She also notes that her employer has announced that the health plan will change again in November.  Headaches are well controlled on her current dose of topiramate.   Review of Systems No chest pain, SOB, palpitations, decreased appetite, abdominal pain, constipation, diarrhea, nausea, vomiting, dizziness, syncope, or sleep disturbance.     Patient Active Problem List   Diagnosis Date Noted  . Chronic daily headache 09/17/2014  . Dysmenorrhea 12/28/2011  . ADD (attention deficit disorder) 09/29/2011     Prior to Admission medications   Medication Sig Start Date End Date Taking? Authorizing Provider  Amphet-Dextroamphet 3-Bead ER (MYDAYIS) 50 MG CP24 Take 50 mg by mouth daily. 02/22/17  Yes Weber, [name] L, PA-C  lisdexamfetamine (VYVANSE) 50 MG capsule Take 1 capsule (50 mg total) by mouth daily. 02/02/17  Yes Weber, [name] L, PA-C  magnesium gluconate (MAGONATE) 500 MG  tablet Take 250 mg by mouth daily.   Yes [provider]  topiramate (TOPAMAX) 100 MG tablet Take 1 tablet (100 mg total) by mouth at bedtime. 09/27/16  Yes Gracey Tolle, PA-C     No Known Allergies     Objective:  Physical Exam  Constitutional: She is oriented to person, place, and time. She appears well-developed and well-nourished. She is active and cooperative. No distress.  BP 132/84 (BP Location: Right Arm, Patient Position: Sitting, Cuff Size: Normal)   Pulse (!) 126   Temp 98.6 F (37 C) (Oral)   Resp 18   Ht 5' 5.5" (1.664 m)   Wt 196 lb 12.8 oz (89.3 kg)   SpO2 98%   BMI 32.25 kg/m   HENT:  Head: Normocephalic and atraumatic.  Right Ear: Hearing normal.  Left Ear: Hearing normal.  Eyes: Conjunctivae are normal. No scleral icterus.  Neck: Normal range of motion. Neck supple. No thyromegaly present.  Cardiovascular: Normal rate, regular rhythm and normal heart sounds.   Pulses:      Radial pulses are 2+ on the right side, and 2+ on the left side.  Pulmonary/Chest: Effort normal and breath sounds normal.  Lymphadenopathy:       Head (right side): No tonsillar, no preauricular, no posterior auricular and no occipital adenopathy present.       Head (left side): No tonsillar, no preauricular, no posterior auricular and no occipital adenopathy present.    She has no cervical adenopathy.       Right: No supraclavicular adenopathy present.  Left: No supraclavicular adenopathy present.  Neurological: She is alert and oriented to person, place, and time. No sensory deficit.  Skin: Skin is warm, dry and intact. No rash noted. No cyanosis or erythema. Nails show no clubbing.  Psychiatric: Her speech is normal and behavior is normal. Judgment and thought content normal. Her mood appears not anxious. Her affect is blunt. Her affect is not angry, not labile and not inappropriate. Cognition and memory are normal. She does not exhibit a depressed mood.          Assessment & Plan:   Problem List Items Addressed This Visit    ADD (attention deficit disorder) - Primary (Chronic)    Inadequate symptom management on Mydais, even at 50 mg dose. Restart Adderall IR. Anticipate need to change again with new health plan in November. Would reconsider Vyvanse.      Relevant Medications   amphetamine-dextroamphetamine (ADDERALL) 30 MG tablet   Dysmenorrhea (Chronic)    Well controlled with medroxyprogesterone injections.      Chronic daily headache    Well controlled on current treatment. Continue topiramate for headache prevention.      Relevant Medications   topiramate (TOPAMAX) 100 MG tablet       Return in about 3 months (around 06/16/2017) for depo-provera and re-evaluation of headache and ADD.   Fernande Bras, PA-C Primary Care at [facility]

## 2017-03-16 NOTE — Progress Notes (Signed)
   Subjective:    Patient ID: Jamie Simmons, female    DOB: February 12, 1991, 26 y.o.   MRN: [id number]  HPI Chief Complaint  Patient presents with  . Medication Refill    Topamax 100 MG, Adderall or Mydayis  . Depo Shot    Patient recently switched to Mydayis  daily from 37.5mg , which she did not like. She reports it "does not feel effective." She notes continued decreased productivity but increased hyperactivity. She relates it is affecting her work, she will stare at her work and but not be able to get any of it done.  She states that her insurance will cover the Adderall IR as of today, which she is interested in switching back to. Patient was previously on Adderall  TID. She will be switching insurance in November. Patient never filled the Vyvanse prescription provided due to the $80 copay.   Denies any chest pain, SOB, palpitations, decreased appetite, abdominal pain, constipation, diarrhea, nausea, vomiting, dizziness, syncope, or sleep disturbance.   Review of Systems As above.     Objective:   Physical Exam  Constitutional: She is oriented to person, place, and time. She appears well-developed and well-nourished. No distress.  HENT:  Head: Normocephalic and atraumatic.  Neck: Neck supple. No JVD present. No tracheal deviation present. No thyromegaly present.  Cardiovascular: Normal rate, regular rhythm and intact distal pulses.  Exam reveals no gallop and no friction rub.   No murmur heard. Pulmonary/Chest: Effort normal and breath sounds normal. No stridor. No respiratory distress. She has no wheezes. She has no rales. She exhibits no tenderness.  Lymphadenopathy:    She has no cervical adenopathy.  Neurological: She is alert and oriented to person, place, and time.  Skin: Skin is warm and dry. No rash noted. She is not diaphoretic. No erythema. No pallor.  Psychiatric: She has a normal mood and affect. Her behavior is normal. Judgment and thought content normal.      Assessment & Plan:  1. Attention deficit disorder (ADD) without hyperactivity - Switch from Mydayis back to Adderall  TID. Patient never tried Vyvanse, that could be an option in the future. If there is difficulty with obtaining medication from the upcoming insurance change, we could consider sending her to [place] [facility].  - amphetamine-dextroamphetamine (ADDERALL) 30 MG tablet; Take 1 tablet by mouth 3 (three) times daily.  Dispense: 90 tablet; Refill: 0  2. Dysmenorrhea  3. Chronic daily headache - topiramate (TOPAMAX) 100 MG tablet; Take 1 tablet (100 mg total) by mouth at bedtime.  Dispense: 90 tablet; Refill: 1  Respectfully, Gala Romney PA-S 2019

## 2017-03-26 NOTE — Assessment & Plan Note (Signed)
Inadequate symptom management on Mydais, even at 50 mg dose. Restart Adderall IR. Anticipate need to change again with new health plan in November. Would reconsider Vyvanse.

## 2017-03-26 NOTE — Assessment & Plan Note (Signed)
Well controlled on current treatment. Continue topiramate for headache prevention.

## 2017-03-26 NOTE — Assessment & Plan Note (Signed)
Well controlled with medroxyprogesterone injections.

## 2017-03-30 ENCOUNTER — Ambulatory Visit (INDEPENDENT_AMBULATORY_CARE_PROVIDER_SITE_OTHER): Payer: PRIVATE HEALTH INSURANCE | Admitting: Physician Assistant

## 2017-03-30 ENCOUNTER — Encounter: Payer: Self-pay | Admitting: Physician Assistant

## 2017-03-30 VITALS — BP 122/86 | HR 62 | Temp 99.2°F | Resp 18 | Ht 65.5 in | Wt 194.8 lb

## 2017-03-30 DIAGNOSIS — H1031 Unspecified acute conjunctivitis, right eye: Secondary | ICD-10-CM

## 2017-03-30 MED ORDER — KETOROLAC TROMETHAMINE 0.5 % OP SOLN: 1.0000 [drp] | Freq: Four times a day (QID) | OPHTHALMIC | 0 refills | Status: DC

## 2017-03-30 NOTE — Progress Notes (Signed)
Subjective:    Patient ID: Jamie Simmons, female    DOB: 09/20/1990, 26 y.o.   MRN: [id number]  HPI  Jamie Simmons is a 26 year old Caucasian female with no chronic medical conditions pertinent to today's visit who presents today with a chief complaint of right Simmons redness. Jamie Simmons reports her right Simmons redness started yesterday. She states her Simmons feels irritated, but it is not painful or pruritic. She does not think anything got into her Simmons to irritate it Jamie Simmons states her right Simmons was watering a little bit today, but she denies discharge or drainage. Jamie Simmons states her vision seems blurry today. She has not used any Simmons drops and she does not wear contacts. Jamie Simmons denies fevers, headaches varied from her usual headaches, nasal congestion, ear pain, or sore throat.   Jamie Simmons denies any known sick contacts and she does not have a history of allergic rhinitis.  Medications:  Prior to Admission medications   Medication Sig Start Date End Date Taking? Authorizing Provider  amphetamine-dextroamphetamine (ADDERALL) 30 MG tablet Take 1 tablet by mouth 3 (three) times daily. 03/16/17  Yes Jeffery, Chelle, PA-C  magnesium gluconate (MAGONATE) 500 MG tablet Take 250 mg by mouth daily.   Yes [provider]  topiramate (TOPAMAX) 100 MG tablet Take 1 tablet (100 mg total) by mouth at bedtime. 03/16/17  Yes Jeffery, Chelle, PA-C  ketorolac (ACULAR) 0.5 % ophthalmic solution Place 1 drop into the right Simmons 4 (four) times daily. 03/30/17   Porfirio OarJeffery, Chelle, PA-C   Allergies:  No Known Allergies  Chronic Medical Conditions:  Patient Active Problem List   Diagnosis Date Noted  . Chronic daily headache 09/17/2014  . Dysmenorrhea 12/28/2011  . ADD (attention deficit disorder) 09/29/2011   Review of Systems     Objective:   Physical Exam  Constitutional: She appears well-developed and well-nourished. She is cooperative.  BP 122/86 (BP Location: Right Arm, Patient Position:  Sitting, Cuff Size: Normal)   Pulse 62   Temp 99.2 F (37.3 C) (Oral)   Resp 18   Ht 5' 5.5" (1.664 m)   Wt 194 lb 12.8 oz (88.4 kg)   SpO2 95%   BMI 31.92 kg/m    HENT:  Head: Normocephalic and atraumatic.  Right Ear: External ear normal.  Left Ear: External ear normal.  Nose: Nose normal.  Mouth/Throat: Oropharynx is clear and moist.  Eyes: Pupils are equal, round, and reactive to light. Right Simmons exhibits no discharge and no hordeolum. No foreign body present in the right Simmons. Left Simmons exhibits no chemosis, no discharge and no hordeolum. No foreign body present in the left Simmons. Right conjunctiva is injected. Right conjunctiva has no hemorrhage. Left conjunctiva is not injected. Left conjunctiva has no hemorrhage.  Slit lamp exam:      The right Simmons shows no corneal abrasion, no corneal ulcer, no foreign body and no fluorescein uptake.       The left Simmons shows no corneal abrasion, no corneal ulcer, no foreign body and no fluorescein uptake.  Neck: Normal range of motion. Neck supple.  Cardiovascular: Normal rate, regular rhythm, normal heart sounds and intact distal pulses.   Pulmonary/Chest: Effort normal and breath sounds normal.  Neurological: She is alert.  Skin: Skin is warm and dry.      Assessment & Plan:  1. Acute conjunctivitis of right Simmons, unspecified acute conjunctivitis type - Begin Ketorlac 0.5% ophthalmic solution. Instructed patient to place 1  drop into right Simmons 4 times per day  - Informed patient she is able to return to work today - Patient to return to clinic if her symptoms worsen or do not improve within the next week.  Judie Petit, PA-S

## 2017-03-30 NOTE — Progress Notes (Signed)
Patient ID: Orland DecStephanie E Geister, female    DOB: 1990/08/29, 26 y.o.   MRN: [id number]  PCP: Porfirio OarJeffery, Dorrine Montone, PA-C  Chief Complaint  Patient presents with  . Eye Problem    X 1 day red right eye    Subjective:   Presents for evaluation of RIGHT eye redness that began yesterday.  The eye feels "irritated" but not itchy or painful.  No recalled FB to the eye. She does NOT wear contacts. No contact with persons with bacterial conjunctivitis to her knowledge. Some increased watering today, no other drainage. Slightly blurry vision. No URI-type symptoms.    Review of Systems As above.    Patient Active Problem List   Diagnosis Date Noted  . Chronic daily headache 09/17/2014  . Dysmenorrhea 12/28/2011  . ADD (attention deficit disorder) 09/29/2011     Prior to Admission medications   Medication Sig Start Date End Date Taking? Authorizing Provider  amphetamine-dextroamphetamine (ADDERALL) 30 MG tablet Take 1 tablet by mouth 3 (three) times daily. 03/16/17  Yes Ahlayah Tarkowski, PA-C  magnesium gluconate (MAGONATE) 500 MG tablet Take 250 mg by mouth daily.   Yes [provider]  topiramate (TOPAMAX) 100 MG tablet Take 1 tablet (100 mg total) by mouth at bedtime. 03/16/17  Yes Devon Kingdon, PA-C     No Known Allergies     Objective:  Physical Exam  Constitutional: She is oriented to person, place, and time. She appears well-developed and well-nourished. She is active and cooperative. No distress.  BP 122/86 (BP Location: Right Arm, Patient Position: Sitting, Cuff Size: Normal)   Pulse 62   Temp 99.2 F (37.3 C) (Oral)   Resp 18   Ht 5' 5.5" (1.664 m)   Wt 194 lb 12.8 oz (88.4 kg)   SpO2 95%   BMI 31.92 kg/m    HENT:  Head: Normocephalic and atraumatic.  Right Ear: Hearing, tympanic membrane, external ear and ear canal normal.  Left Ear: Hearing, tympanic membrane, external ear and ear canal normal.  Nose: Nose normal.  Mouth/Throat: Oropharynx is  clear and moist and mucous membranes are normal.  Eyes: EOM and lids are normal. Pupils are equal, round, and reactive to light. Right eye exhibits no chemosis, no discharge, no exudate and no hordeolum. No foreign body present in the right eye. Left eye exhibits no chemosis, no discharge, no exudate and no hordeolum. No foreign body present in the left eye. Right conjunctiva is injected. Right conjunctiva has no hemorrhage. Left conjunctiva is not injected. Left conjunctiva has no hemorrhage. No scleral icterus.  Fundoscopic exam:      The right eye shows no hemorrhage and no papilledema. The right eye shows red reflex.       The left eye shows no hemorrhage and no papilledema. The left eye shows red reflex.  Verbal consent obtained.  The eye was anesthetized with 2 drops of proparacaine, and stained with fluorescein. Examination under woods lamp does not reveal a foreign body or area of increased stain uptake.  The eye was then irrigated copiously with saline.   Pulmonary/Chest: Effort normal.  Neurological: She is alert and oriented to person, place, and time.  Psychiatric: She has a normal mood and affect. Her speech is normal and behavior is normal.        Visual Acuity Screening   Right eye Left eye Both eyes  Without correction: 20/20 20/20 20/20   With correction:          Assessment &  Plan:   1. Acute conjunctivitis of right eye, unspecified acute conjunctivitis type Irritant etiology, otherwise unknown. Supportive care. RTC if develops new symptoms or is these persist. - ketorolac (ACULAR) 0.5 % ophthalmic solution; Place 1 drop into the right eye 4 (four) times daily.  Dispense: 5 mL; Refill: 0    Return if symptoms worsen or fail to improve.   Fernande Bras, PA-C Primary Care at [facility]

## 2017-03-30 NOTE — Patient Instructions (Addendum)
If you develop drainage from the eye that is thick, opaque, like pus, or if the symptoms persist, let me know.    IF you received an x-ray today, you will receive an invoice from [facility]. Please contact [facility] at [phone] with questions or concerns regarding your invoice.   IF you received labwork today, you will receive an invoice from [facility]. Please contact [facility] at [phone] with questions or concerns regarding your invoice.   Our billing staff will not be able to assist you with questions regarding bills from these companies.  You will be contacted with the lab results as soon as they are available. The fastest way to get your results is to activate your My Chart account. Instructions are located on the last page of this paperwork. If you have not heard from us regarding the results in 2 weeks, please contact this office.

## 2017-04-02 NOTE — Telephone Encounter (Signed)
error 

## 2017-05-02 ENCOUNTER — Encounter: Payer: Self-pay | Admitting: Physician Assistant

## 2017-05-02 DIAGNOSIS — F988 Other specified behavioral and emotional disorders with onset usually occurring in childhood and adolescence: Secondary | ICD-10-CM

## 2017-05-03 ENCOUNTER — Telehealth: Payer: Self-pay | Admitting: Physician Assistant

## 2017-05-03 NOTE — Telephone Encounter (Signed)
Pt want to know status of Rx Adderall

## 2017-05-03 NOTE — Telephone Encounter (Signed)
Copied from CRM [phone]. Topic: Inquiry >> May 03, 2017  4:52 PM Raquel SarnaHayes, Teresa G wrote: Pt want to know status of Rx Adderall.

## 2017-05-04 MED ORDER — AMPHETAMINE-DEXTROAMPHETAMINE 30 MG PO TABS: 30.0000 mg | ORAL_TABLET | Freq: Three times a day (TID) | ORAL | 0 refills | Status: DC

## 2017-05-04 NOTE — Telephone Encounter (Signed)
Patient notified via My Chart. Rx sent electronically.  Meds ordered this encounter  Medications  . amphetamine-dextroamphetamine (ADDERALL) 30 MG tablet    Sig: Take 1 tablet by mouth 3 (three) times daily.    Dispense:  90 tablet    Refill:  0    Order Specific Question:   Supervising Provider    Answer:   Clelia CroftSHAW, EVA N [[id number]]

## 2017-05-04 NOTE — Telephone Encounter (Signed)
Completed in My Chart message thread dated 11/26.

## 2017-05-04 NOTE — Telephone Encounter (Signed)
See phone message: req for Adderall Last rx #90 on 03/16/17 Sig: 1 tab 3x daily.

## 2017-05-26 ENCOUNTER — Encounter: Payer: Self-pay | Admitting: Physician Assistant

## 2017-05-26 ENCOUNTER — Other Ambulatory Visit: Payer: Self-pay | Admitting: Physician Assistant

## 2017-05-26 DIAGNOSIS — F988 Other specified behavioral and emotional disorders with onset usually occurring in childhood and adolescence: Secondary | ICD-10-CM

## 2017-05-27 MED ORDER — AMPHETAMINE-DEXTROAMPHETAMINE 30 MG PO TABS: 30.0000 mg | ORAL_TABLET | Freq: Three times a day (TID) | ORAL | 0 refills | Status: DC

## 2017-05-27 NOTE — Telephone Encounter (Signed)
Patient notified via My Chart.  Rx sent electronically. Meds ordered this encounter  Medications  . amphetamine-dextroamphetamine (ADDERALL) 30 MG tablet    Sig: Take 1 tablet by mouth 3 (three) times daily.    Dispense:  90 tablet    Refill:  0

## 2017-05-27 NOTE — Telephone Encounter (Signed)
Please advise 

## 2017-05-27 NOTE — Telephone Encounter (Signed)
Patient notified via My Chart. Rx sent electronically.  Meds ordered this encounter  Medications  . amphetamine-dextroamphetamine (ADDERALL) 30 MG tablet    Sig: Take 1 tablet by mouth 3 (three) times daily.    Dispense:  90 tablet    Refill:  0    Order Specific Question:   Supervising Provider    Answer:   SHAW, EVA N [[id number]]    

## 2017-06-08 ENCOUNTER — Other Ambulatory Visit: Payer: Self-pay

## 2017-06-08 ENCOUNTER — Ambulatory Visit: Payer: PRIVATE HEALTH INSURANCE | Admitting: Physician Assistant

## 2017-06-08 ENCOUNTER — Encounter: Payer: Self-pay | Admitting: Physician Assistant

## 2017-06-08 VITALS — BP 118/80 | HR 104 | Temp 98.8°F | Resp 18 | Ht 65.5 in | Wt 199.4 lb

## 2017-06-08 DIAGNOSIS — R000 Tachycardia, unspecified: Secondary | ICD-10-CM

## 2017-06-08 DIAGNOSIS — F988 Other specified behavioral and emotional disorders with onset usually occurring in childhood and adolescence: Secondary | ICD-10-CM

## 2017-06-08 DIAGNOSIS — R51 Headache: Secondary | ICD-10-CM | POA: Diagnosis not present

## 2017-06-08 DIAGNOSIS — R519 Headache, unspecified: Secondary | ICD-10-CM

## 2017-06-08 DIAGNOSIS — N946 Dysmenorrhea, unspecified: Secondary | ICD-10-CM

## 2017-06-08 DIAGNOSIS — Z13220 Encounter for screening for lipoid disorders: Secondary | ICD-10-CM | POA: Diagnosis not present

## 2017-06-08 MED ORDER — LISDEXAMFETAMINE DIMESYLATE 40 MG PO CAPS: 40.0000 mg | ORAL_CAPSULE | ORAL | 0 refills | Status: DC

## 2017-06-08 NOTE — Patient Instructions (Addendum)
Try the Vyvanse. Let me know how it's working. We can raise or lower the dose, depending on it's effect or any side effects.    IF you received an x-ray today, you will receive an invoice from [facility]. Please contact [facility] at [phone] with questions or concerns regarding your invoice.   IF you received labwork today, you will receive an invoice from [facility]. Please contact [facility] at [phone] with questions or concerns regarding your invoice.   Our billing staff will not be able to assist you with questions regarding bills from these companies.  You will be contacted with the lab results as soon as they are available. The fastest way to get your results is to activate your My Chart account. Instructions are located on the last page of this paperwork. If you have not heard from us regarding the results in 2 weeks, please contact this office.

## 2017-06-08 NOTE — Assessment & Plan Note (Signed)
Continue to be happy with Depo Provera. Injection administered today.

## 2017-06-08 NOTE — Assessment & Plan Note (Signed)
Did not tolerate topiramate due to paresthesias in the hands. Not a primary issue for her at this point.

## 2017-06-08 NOTE — Assessment & Plan Note (Signed)
Well controlled, but Adderall may contribute to tachycardia.  Try change to Vyvanse.

## 2017-06-08 NOTE — Progress Notes (Signed)
Patient ID: Jamie Simmons, female    DOB: February 12, 1991, 27 y.o.   MRN: [id number]  PCP: Porfirio OarJeffery, Aftan Vint, PA-C  Chief Complaint  Patient presents with  . Medication Management    Pt states she is here to check in for Adderall  . Depo Provera  . Follow-up    Subjective:   Presents for evaluation of ADD and dysmenorrhea.  Since October, she's been back on TID dosing of immediate release Adderall. This had been effective previously, and changed to extended release due to change in insurance coverage. She also tried Mydayis, but that wasn't effective.  Continues to be happy with Depo-Provera for treatment of dysmenorrhea. Last dose 03/16/2017.  Chronic daily headache is unchanged. At her last visit, she was prescribed topiramate in attempt to reduce the frequency of headache. She stopped the topiramate because of paresthesias in her hands, constant. Wasn't sure that she took it long enough to see any change in the ehadaches.  Notes that her pulse has been elevated. Usually 110-120 with light activity, 130's with moderate activity. Thinks that it's higher today because she was on the phone with the insurance company and her grandmother, with whom she has a strained relationship, came by. Even sleeping, it's in the 80's. Not experiencing racing heart rate, palpitations, irregular heartbeat, dizziness, CP, SOB, just notes this on her smart watch.   Review of Systems As above.    Patient Active Problem List   Diagnosis Date Noted  . Chronic daily headache 09/17/2014  . Dysmenorrhea 12/28/2011  . ADD (attention deficit disorder) 09/29/2011     Prior to Admission medications   Medication Sig Start Date End Date Taking? Authorizing Provider  amphetamine-dextroamphetamine (ADDERALL) 30 MG tablet Take 1 tablet by mouth 3 (three) times daily. 05/27/17  Yes Waniya Hoglund, PA-C  ketorolac (ACULAR) 0.5 % ophthalmic solution Place 1 drop into the right eye 4 (four) times daily.  03/30/17  Yes Rishon Thilges, PA-C  magnesium gluconate (MAGONATE) 500 MG tablet Take 250 mg by mouth daily.   Yes [provider]  topiramate (TOPAMAX) 100 MG tablet Take 1 tablet (100 mg total) by mouth at bedtime. 03/16/17  Yes Prarthana Parlin, PA-C     No Known Allergies     Objective:  Physical Exam  Constitutional: She is oriented to person, place, and time. She appears well-developed and well-nourished. She is active and cooperative. No distress.  BP 118/80 (BP Location: Left Arm, Patient Position: Sitting, Cuff Size: Large)   Pulse (!) 104   Temp 98.8 F (37.1 C) (Oral)   Resp 18   Ht 5' 5.5" (1.664 m)   Wt 199 lb 6.4 oz (90.4 kg)   SpO2 98%   BMI 32.68 kg/m   HENT:  Head: Normocephalic and atraumatic.  Right Ear: Hearing normal.  Left Ear: Hearing normal.  Eyes: Conjunctivae are normal. No scleral icterus.  Neck: Normal range of motion. Neck supple. No thyromegaly present.  Cardiovascular: Regular rhythm and normal heart sounds. Tachycardia present.  Pulses:      Radial pulses are 2+ on the right side, and 2+ on the left side.  Pulmonary/Chest: Effort normal and breath sounds normal.  Lymphadenopathy:       Head (right side): No tonsillar, no preauricular, no posterior auricular and no occipital adenopathy present.       Head (left side): No tonsillar, no preauricular, no posterior auricular and no occipital adenopathy present.    She has no cervical adenopathy.  Right: No supraclavicular adenopathy present.       Left: No supraclavicular adenopathy present.  Neurological: She is alert and oriented to person, place, and time. No sensory deficit.  Skin: Skin is warm, dry and intact. No rash noted. No cyanosis or erythema. Nails show no clubbing.  Psychiatric: She has a normal mood and affect. Her speech is normal and behavior is normal.       Assessment & Plan:   Problem List Items Addressed This Visit    ADD (attention deficit disorder) (Chronic)     Well controlled, but Adderall may contribute to tachycardia.  Try change to Vyvanse.      Relevant Medications   lisdexamfetamine (VYVANSE) 40 MG capsule   Dysmenorrhea - Primary (Chronic)    Continue to be happy with Depo Provera. Injection administered today.       Chronic daily headache    Did not tolerate topiramate due to paresthesias in the hands. Not a primary issue for her at this point.       Other Visit Diagnoses    Tachycardia       Await lab results. Change from immediate release Adderall to Vyvanse.    Relevant Orders   CBC with Differential/Platelet   Comprehensive metabolic panel   TSH   Screening for hyperlipidemia       Relevant Orders   Lipid panel       Return in about 3 months (around 09/06/2017) for re-evaluation of ADD and headache.   Fernande Bras, PA-C Primary Care at [facility]

## 2017-06-09 LAB — LIPID PANEL
CHOL/HDL RATIO: 3.8 ratio (ref 0.0–4.4)
Cholesterol, Total: 149 mg/dL (ref 100–199)
HDL: 39 mg/dL — AB (ref >39)
LDL CALC: 92 mg/dL (ref 0–99)
Triglycerides: 88 mg/dL (ref 0–149)
VLDL CHOLESTEROL CAL: 18 mg/dL (ref 5–40)

## 2017-06-09 LAB — TSH: TSH: 3.66 u[IU]/mL (ref 0.450–4.500)

## 2017-06-09 LAB — CBC WITH DIFFERENTIAL/PLATELET
Basophils Absolute: 0 10*3/uL (ref 0.0–0.2)
Basos: 1 %
EOS (ABSOLUTE): 0.2 10*3/uL (ref 0.0–0.4)
EOS: 3 %
HEMATOCRIT: 43.4 % (ref 34.0–46.6)
HEMOGLOBIN: 14.9 g/dL (ref 11.1–15.9)
IMMATURE GRANS (ABS): 0 10*3/uL (ref 0.0–0.1)
Immature Granulocytes: 0 %
LYMPHS ABS: 2.3 10*3/uL (ref 0.7–3.1)
LYMPHS: 38 %
MCH: 30.5 pg (ref 26.6–33.0)
MCHC: 34.3 g/dL (ref 31.5–35.7)
MCV: 89 fL (ref 79–97)
MONOCYTES: 5 %
Monocytes Absolute: 0.3 10*3/uL (ref 0.1–0.9)
NEUTROS ABS: 3.1 10*3/uL (ref 1.4–7.0)
Neutrophils: 53 %
Platelets: 345 10*3/uL (ref 150–379)
RBC: 4.88 x10E6/uL (ref 3.77–5.28)
RDW: 13.2 % (ref 12.3–15.4)
WBC: 5.9 10*3/uL (ref 3.4–10.8)

## 2017-06-09 LAB — COMPREHENSIVE METABOLIC PANEL
ALBUMIN: 4.2 g/dL (ref 3.5–5.5)
ALK PHOS: 82 IU/L (ref 39–117)
ALT: 30 IU/L (ref 0–32)
AST: 28 IU/L (ref 0–40)
Albumin/Globulin Ratio: 1.4 (ref 1.2–2.2)
BUN / CREAT RATIO: 12 (ref 9–23)
BUN: 10 mg/dL (ref 6–20)
Bilirubin Total: 0.3 mg/dL (ref 0.0–1.2)
CO2: 17 mmol/L — AB (ref 20–29)
CREATININE: 0.85 mg/dL (ref 0.57–1.00)
Calcium: 9.8 mg/dL (ref 8.7–10.2)
Chloride: 108 mmol/L — ABNORMAL HIGH (ref 96–106)
GFR calc non Af Amer: 96 mL/min/{1.73_m2} (ref >59)
GFR, EST AFRICAN AMERICAN: 110 mL/min/{1.73_m2} (ref >59)
GLOBULIN, TOTAL: 3 g/dL (ref 1.5–4.5)
GLUCOSE: 105 mg/dL — AB (ref 65–99)
Potassium: 4.3 mmol/L (ref 3.5–5.2)
Sodium: 142 mmol/L (ref 134–144)
TOTAL PROTEIN: 7.2 g/dL (ref 6.0–8.5)

## 2017-06-25 ENCOUNTER — Encounter: Payer: Self-pay | Admitting: Physician Assistant

## 2017-06-25 MED ORDER — LISDEXAMFETAMINE DIMESYLATE 10 MG PO CAPS
10.0000 mg | ORAL_CAPSULE | Freq: Every day | ORAL | 0 refills | Status: DC
Start: 2017-06-25 — End: 2017-07-05

## 2017-06-29 ENCOUNTER — Encounter: Payer: Self-pay | Admitting: Physician Assistant

## 2017-06-29 DIAGNOSIS — F988 Other specified behavioral and emotional disorders with onset usually occurring in childhood and adolescence: Secondary | ICD-10-CM

## 2017-07-04 ENCOUNTER — Encounter: Payer: Self-pay | Admitting: Physician Assistant

## 2017-07-05 MED ORDER — AMPHETAMINE-DEXTROAMPHETAMINE 30 MG PO TABS: 30.0000 mg | ORAL_TABLET | Freq: Three times a day (TID) | ORAL | 0 refills | Status: DC

## 2017-07-05 MED ORDER — AMPHETAMINE-DEXTROAMPHETAMINE 30 MG PO TABS: 30.0000 mg | ORAL_TABLET | Freq: Every day | ORAL | 0 refills | Status: DC

## 2017-07-05 NOTE — Addendum Note (Signed)
Addended by: Fernande BrasJEFFERY, Idy Rawling S on: 07/05/2017 11:52 AM   Modules accepted: Orders

## 2017-07-05 NOTE — Addendum Note (Signed)
Addended by: Fernande BrasJEFFERY, Dinesha Twiggs S on: 07/05/2017 11:39 AM   Modules accepted: Orders

## 2017-07-05 NOTE — Telephone Encounter (Signed)
THEN, realized the SIG and Quantity were not correct. Spoke to pharmacy to CANCEL the QD #30 Rxs, and sent corrected TID #90 Rxs.  Meds ordered this encounter  Medications  . amphetamine-dextroamphetamine (ADDERALL) 30 MG tablet    Sig: Take 1 tablet by mouth 3 (three) times daily.    Dispense:  90 tablet    Refill:  0    Order Specific Question:   Supervising Provider    Answer:   SHAW, EVA N [[id number]]  . amphetamine-dextroamphetamine (ADDERALL) 30 MG tablet    Sig: Take 1 tablet by mouth 3 (three) times daily.    Dispense:  90 tablet    Refill:  0    Order Specific Question:   Supervising Provider    Answer:   SHAW, EVA N [[id number]]  . amphetamine-dextroamphetamine (ADDERALL) 30 MG tablet    Sig: Take 1 tablet by mouth 3 (three) times daily.    Dispense:  90 tablet    Refill:  0    Order Specific Question:   Supervising Provider    Answer:   Clelia CroftSHAW, EVA N [[id number]]

## 2017-07-05 NOTE — Telephone Encounter (Signed)
Patient notified via My Chart.  Rx sent electronically. Inadvertently printed these Rxs. Reordered to send electronically. Meds ordered this encounter  Medications  . DISCONTD: amphetamine-dextroamphetamine (ADDERALL) 30 MG tablet    Sig: Take 1 tablet by mouth daily before breakfast.    Dispense:  30 tablet    Refill:  0    Order Specific Question:   Supervising Provider    Answer:   SHAW, EVA N [[id number]]  . DISCONTD: amphetamine-dextroamphetamine (ADDERALL) 30 MG tablet    Sig: Take 1 tablet by mouth daily before breakfast.    Dispense:  30 tablet    Refill:  0    Order Specific Question:   Supervising Provider    Answer:   SHAW, EVA N [[id number]]  . DISCONTD: amphetamine-dextroamphetamine (ADDERALL) 30 MG tablet    Sig: Take 1 tablet by mouth daily before breakfast.    Dispense:  30 tablet    Refill:  0    Order Specific Question:   Supervising Provider    Answer:   SHAW, EVA N [[id number]]  . amphetamine-dextroamphetamine (ADDERALL) 30 MG tablet    Sig: Take 1 tablet by mouth daily before breakfast.    Dispense:  30 tablet    Refill:  0    Order Specific Question:   Supervising Provider    Answer:   SHAW, EVA N [[id number]]  . amphetamine-dextroamphetamine (ADDERALL) 30 MG tablet    Sig: Take 1 tablet by mouth daily before breakfast.    Dispense:  30 tablet    Refill:  0    Order Specific Question:   Supervising Provider    Answer:   SHAW, EVA N [[id number]]  . amphetamine-dextroamphetamine (ADDERALL) 30 MG tablet    Sig: Take 1 tablet by mouth daily before breakfast.    Dispense:  30 tablet    Refill:  0    Order Specific Question:   Supervising Provider    Answer:   Clelia CroftSHAW, EVA N [[id number]]

## 2017-08-11 ENCOUNTER — Ambulatory Visit: Payer: PRIVATE HEALTH INSURANCE | Admitting: Family Medicine

## 2017-08-11 ENCOUNTER — Other Ambulatory Visit: Payer: Self-pay

## 2017-08-11 ENCOUNTER — Encounter: Payer: Self-pay | Admitting: Family Medicine

## 2017-08-11 VITALS — BP 122/64 | HR 117 | Temp 99.3°F | Ht 66.0 in | Wt 204.8 lb

## 2017-08-11 DIAGNOSIS — R6889 Other general symptoms and signs: Secondary | ICD-10-CM | POA: Diagnosis not present

## 2017-08-11 DIAGNOSIS — J069 Acute upper respiratory infection, unspecified: Secondary | ICD-10-CM | POA: Diagnosis not present

## 2017-08-11 DIAGNOSIS — F988 Other specified behavioral and emotional disorders with onset usually occurring in childhood and adolescence: Secondary | ICD-10-CM | POA: Diagnosis not present

## 2017-08-11 DIAGNOSIS — H60393 Other infective otitis externa, bilateral: Secondary | ICD-10-CM

## 2017-08-11 LAB — POC INFLUENZA A&B (BINAX/QUICKVUE)
INFLUENZA B, POC: NEGATIVE
Influenza A, POC: NEGATIVE

## 2017-08-11 MED ORDER — HYDROCORTISONE-ACETIC ACID 1-2 % OT SOLN: 3.0000 [drp] | Freq: Three times a day (TID) | OTIC | 0 refills | Status: DC

## 2017-08-11 NOTE — Patient Instructions (Addendum)
If you have thyroid disease, diabetes, hypertension, tachycardia or seizure disorder If you take heart meds or ADD meds   You should not take Dextromorphan or Phenylephrine These are common found in decongestant and cold medications This can cause very high pulses and high blood pressure.  Ask you pharmacist to make sure this ingredient is not present     IF you received an x-ray today, you will receive an invoice from [facility]. Please contact [facility] at [phone] with questions or concerns regarding your invoice.   IF you received labwork today, you will receive an invoice from [facility]. Please contact [facility] at [phone] with questions or concerns regarding your invoice.   Our billing staff will not be able to assist you with questions regarding bills from these companies.  You will be contacted with the lab results as soon as they are available. The fastest way to get your results is to activate your My Chart account. Instructions are located on the last page of this paperwork. If you have not heard from us regarding the results in 2 weeks, please contact this office.     Upper Respiratory Infection, Adult Most upper respiratory infections (URIs) are a viral infection of the air passages leading to the lungs. A URI affects the nose, throat, and upper air passages. The most common type of URI is nasopharyngitis and is typically referred to as "the common cold." URIs run their course and usually go away on their own. Most of the time, a URI does not require medical attention, but sometimes a bacterial infection in the upper airways can follow a viral infection. This is called a secondary infection. Sinus and middle ear infections are common types of secondary upper respiratory infections. Bacterial pneumonia can also complicate a URI. A URI can worsen asthma and chronic obstructive pulmonary disease (COPD). Sometimes, these complications can require emergency  medical care and may be life threatening. What are the causes? Almost all URIs are caused by viruses. A virus is a type of germ and can spread from one person to another. What increases the risk? You may be at risk for a URI if:  You smoke.  You have chronic heart or lung disease.  You have a weakened defense (immune) system.  You are very young or very old.  You have nasal allergies or asthma.  You work in crowded or poorly ventilated areas.  You work in health care facilities or schools.  What are the signs or symptoms? Symptoms typically develop 2-3 days after you come in contact with a cold virus. Most viral URIs last 7-10 days. However, viral URIs from the influenza virus (flu virus) can last 14-18 days and are typically more severe. Symptoms may include:  Runny or stuffy (congested) nose.  Sneezing.  Cough.  Sore throat.  Headache.  Fatigue.  Fever.  Loss of appetite.  Pain in your forehead, behind your eyes, and over your cheekbones (sinus pain).  Muscle aches.  How is this diagnosed? Your health care provider may diagnose a URI by:  Physical exam.  Tests to check that your symptoms are not due to another condition such as: ? Strep throat. ? Sinusitis. ? Pneumonia. ? Asthma.  How is this treated? A URI goes away on its own with time. It cannot be cured with medicines, but medicines may be prescribed or recommended to relieve symptoms. Medicines may help:  Reduce your fever.  Reduce your cough.  Relieve nasal congestion.  Follow these instructions at  home:  Take medicines only as directed by your health care provider.  Gargle warm saltwater or take cough drops to comfort your throat as directed by your health care provider.  Use a warm mist humidifier or inhale steam from a shower to increase air moisture. This may make it easier to breathe.  Drink enough fluid to keep your urine clear or pale yellow.  Eat soups and other clear broths and  maintain good nutrition.  Rest as needed.  Return to work when your temperature has returned to normal or as your health care provider advises. You may need to stay home longer to avoid infecting others. You can also use a face mask and careful hand washing to prevent spread of the virus.  Increase the usage of your inhaler if you have asthma.  Do not use any tobacco products, including cigarettes, chewing tobacco, or electronic cigarettes. If you need help quitting, ask your health care provider. How is this prevented? The best way to protect yourself from getting a cold is to practice good hygiene.  Avoid oral or hand contact with people with cold symptoms.  Wash your hands often if contact occurs.  There is no clear evidence that vitamin C, vitamin E, echinacea, or exercise reduces the chance of developing a cold. However, it is always recommended to get plenty of rest, exercise, and practice good nutrition. Contact a health care provider if:  You are getting worse rather than better.  Your symptoms are not controlled by medicine.  You have chills.  You have worsening shortness of breath.  You have brown or red mucus.  You have yellow or brown nasal discharge.  You have pain in your face, especially when you bend forward.  You have a fever.  You have swollen neck glands.  You have pain while swallowing.  You have white areas in the back of your throat. Get help right away if:  You have severe or persistent: ? Headache. ? Ear pain. ? Sinus pain. ? Chest pain.  You have chronic lung disease and any of the following: ? Wheezing. ? Prolonged cough. ? Coughing up blood. ? A change in your usual mucus.  You have a stiff neck.  You have changes in your: ? Vision. ? Hearing. ? Thinking. ? Mood. This information is not intended to replace advice given to you by your health care provider. Make sure you discuss any questions you have with your health care  provider. Document Released: 11/17/2000 Document Revised: 01/25/2016 Document Reviewed: 08/29/2013 Elsevier Interactive Patient Education  [employer].

## 2017-08-11 NOTE — Progress Notes (Signed)
Chief Complaint  Patient presents with  . Cough    started today   . soar throat    Aches and pains, worse headache, and woke up like this.     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  She is taking her ADD meds and nyquil and is not experiencing any palpitations or dizziness.   Past Medical History:  Diagnosis Date  . Attention deficit disorder   . Migraine   . Nephrolithiasis 02/2012    Current Outpatient Medications  Medication Sig Dispense Refill  . acetic acid-hydrocortisone (VOSOL-HC) OTIC solution Place 3 drops into both ears 3 (three) times daily. 10 mL 0  . amphetamine-dextroamphetamine (ADDERALL) 30 MG tablet Take 1 tablet by mouth 3 (three) times daily. 90 tablet 0  . amphetamine-dextroamphetamine (ADDERALL) 30 MG tablet Take 1 tablet by mouth 3 (three) times daily. 90 tablet 0  . amphetamine-dextroamphetamine (ADDERALL) 30 MG tablet Take 1 tablet by mouth 3 (three) times daily. 90 tablet 0  . magnesium gluconate (MAGONATE) 500 MG tablet Take 250 mg by mouth daily.    Marland Kitchen topiramate (TOPAMAX) 100 MG tablet Take 1 tablet (100 mg total) by mouth at bedtime. 90 tablet 1   Current Facility-Administered Medications  Medication Dose Route Frequency Provider Last Rate Last Dose  . medroxyPROGESTERone (DEPO-PROVERA) injection 150 mg  150 mg Intramuscular Q90 days Porfirio Oar, PA-C   150 mg at 03/16/17 1621    Allergies: No Known Allergies  Past Surgical History:  Procedure Laterality Date  . WISDOM TOOTH EXTRACTION      Social History   Socioeconomic History  . Marital status: Single    Spouse name: n/a  .  Number of children: 0  . Years of education: 16  . Highest education level: Bachelor's degree (e.g., BA, AB, BS)  Social Needs  . Financial resource strain: None  . Food insecurity - worry: None  . Food insecurity - inability: None  . Transportation needs - medical: None  . Transportation needs - non-medical: None  Occupational History  . Occupation: Quarry manager  Tobacco Use  . Smoking status: Never Smoker  . Smokeless tobacco: Never Used  Substance and Sexual Activity  . Alcohol use: No  . Drug use: No  . Sexual activity: Yes    Birth control/protection: Spermicide, Injection  Other Topics Concern  . None  Social History Narrative   Lives alone.   Mother and mother's wife, grandmother, younger sister live locally.   Right-handed.   2 cups caffeine daily.   Speaks [place].    Family History  Problem Relation Age of Onset  . Drug abuse Mother   . Migraines Mother   . Hypertension Mother   . Diabetes Father   . Obesity Father      ROS Review of Systems See HPI Constitution: No fevers or chills No malaise No diaphoresis Skin: No rash or itching Eyes: no blurry vision, no double vision GU: no dysuria or hematuria Neuro: no dizziness or headaches all others reviewed and negative   Objective: Vitals:  08/11/17 1327  BP: 122/64  Pulse: (!) 117  Temp: 99.3 F (37.4 C)  TempSrc: Oral  SpO2: 98%  Weight: 204 lb 12.8 oz (92.9 kg)  Height: 5\' 6"  (1.676 m)    Physical Exam General: alert, oriented, in NAD Head: normocephalic, atraumatic, no sinus tenderness Eyes: EOM intact, no scleral icterus or conjunctival injection Ears: TM clear bilaterally, external canal erythematous bilaterally, no cerumen Nose: mucosa nonerythematous, nonedematous Throat: no pharyngeal exudate or erythema Lymph: no posterior auricular, submental or cervical lymph adenopathy Heart: normal rate, normal sinus rhythm, no murmurs Lungs: clear to auscultation  bilaterally, no wheezing  Assessment and Plan Jamie Simmons was seen today for cough and soar throat.  Diagnoses and all orders for this visit:  Flu-like symptoms -     POC Influenza A&B(BINAX/QUICKVUE)  Acute URI- flu swab negative Discussed droplet precautions  Infective otitis externa of both ears- advised vosol and avoiding swabs in the ear -     acetic acid-hydrocortisone (VOSOL-HC) OTIC solution; Place 3 drops into both ears 3 (three) times daily.  ADD - gave precautions against stimulants in cough meds such as dextromorphan and phenylephrine   Jamie Simmons A Creta LevinStallings

## 2017-08-18 ENCOUNTER — Ambulatory Visit (INDEPENDENT_AMBULATORY_CARE_PROVIDER_SITE_OTHER): Payer: PRIVATE HEALTH INSURANCE

## 2017-08-18 ENCOUNTER — Ambulatory Visit: Payer: PRIVATE HEALTH INSURANCE | Admitting: Physician Assistant

## 2017-08-18 ENCOUNTER — Other Ambulatory Visit: Payer: Self-pay

## 2017-08-18 ENCOUNTER — Encounter: Payer: Self-pay | Admitting: Physician Assistant

## 2017-08-18 VITALS — BP 114/88 | HR 117 | Temp 98.9°F | Resp 16 | Ht 65.75 in | Wt 230.4 lb

## 2017-08-18 DIAGNOSIS — R059 Cough, unspecified: Secondary | ICD-10-CM

## 2017-08-18 DIAGNOSIS — R05 Cough: Secondary | ICD-10-CM | POA: Diagnosis not present

## 2017-08-18 DIAGNOSIS — R058 Other specified cough: Secondary | ICD-10-CM

## 2017-08-18 MED ORDER — BENZONATATE 100 MG PO CAPS: 100.0000 mg | ORAL_CAPSULE | Freq: Three times a day (TID) | ORAL | 0 refills | Status: DC | PRN

## 2017-08-18 MED ORDER — GUAIFENESIN ER 1200 MG PO TB12: 1.0000 | ORAL_TABLET | Freq: Two times a day (BID) | ORAL | 0 refills | Status: DC | PRN

## 2017-08-18 NOTE — Progress Notes (Signed)
Jamie Simmons    MRN: [id number] DOB: Mar 30, 1991  Subjective:   Jamie Simmons is a 27 y.o. female presenting for chief complaint of Cough .  Reports one week history of continued hacking cough.  Mostly dry but will be occasionally productive. Was seen here for flu like illness one week ago and notes her head cold sx resolved but cough continued. Has tried cough drops with no full relief. Denies fever, sinus pain, rhinorrhea, ear pain, ear drainage, wheezing, shortness of breath and chest pain, nausea, vomiting, abdominal pain and diarrhea. Denies history of seasonal allergies or asthma. Patient has not had flu shot this season. Denies smoking.  Denies any other aggravating or relieving factors, no other questions or concerns.  Jose has a current medication list which includes the following prescription(s): acetic acid-hydrocortisone, amphetamine-dextroamphetamine, amphetamine-dextroamphetamine, amphetamine-dextroamphetamine, benzonatate, guaifenesin, magnesium gluconate, and topiramate, and the following Facility-Administered Medications: medroxyprogesterone. Also has No Known Allergies.  Jamie Simmons  has a past medical history of Attention deficit disorder, Migraine, and Nephrolithiasis (02/2012). Also  has a past surgical history that includes Wisdom tooth extraction.   Objective:   Vitals: BP 114/88 (BP Location: Left Arm, Patient Position: Sitting, Cuff Size: Large)   Pulse (!) 117   Temp 98.9 F (37.2 C) (Oral)   Resp 16   Ht 5' 5.75" (1.67 m)   Wt 230 lb 6.4 oz (104.5 kg)   SpO2 98%   BMI 37.47 kg/m   Physical Exam  Constitutional: She is oriented to person, place, and time. She appears well-developed and well-nourished.  HENT:  Head: Normocephalic and atraumatic.  Right Ear: Tympanic membrane, external ear and ear canal normal.  Left Ear: Tympanic membrane, external ear and ear canal normal.  Nose: Nose normal. Right sinus exhibits no maxillary sinus tenderness and no frontal  sinus tenderness. Left sinus exhibits no maxillary sinus tenderness and no frontal sinus tenderness.  Mouth/Throat: Uvula is midline, oropharynx is clear and moist and mucous membranes are normal. No tonsillar exudate.  Eyes: Conjunctivae are normal.  Neck: Normal range of motion.  Cardiovascular: Normal rate, regular rhythm, normal heart sounds and intact distal pulses.  Pulmonary/Chest: Effort normal.  Difficult lung exam due to cough.   Lymphadenopathy:       Head (right side): No submental, no submandibular, no tonsillar, no preauricular, no posterior auricular and no occipital adenopathy present.       Head (left side): No submental, no submandibular, no tonsillar, no preauricular, no posterior auricular and no occipital adenopathy present.    She has no cervical adenopathy.       Right: No supraclavicular adenopathy present.       Left: No supraclavicular adenopathy present.  Neurological: She is alert and oriented to person, place, and time.  Skin: Skin is warm and dry.  Psychiatric: She has a normal mood and affect.  Vitals reviewed.   No results found for this or any previous visit (from the past 24 hour(s)).  Pulse Readings from Last 3 Encounters:  08/18/17 (!) 117  08/11/17 (!) 117  06/08/17 (!) 104   Dg Chest 2 View  Result Date: 08/18/2017 CLINICAL DATA:  Productive cough for 1 week. EXAM: CHEST - 2 VIEW COMPARISON:  10/29/2011 FINDINGS: The heart size and mediastinal contours are within normal limits. Both lungs are clear. No pleural effusion or pneumothorax. The visualized skeletal structures are unremarkable. IMPRESSION: Normal chest radiographs. Electronically Signed   By: Amie Portland M.D.   On: 08/18/2017 12:34     Assessment  and Plan :  1. Cough 2. Post-viral cough syndrome Pt is overall well appearing, no distress.  She is afebrile.  SPO2 98%.  Chest x-ray normal.  History and physical exam findings most consistent with post viral cough syndrome.  Recommend  symptomatic treatment at this time.  Advised to return to clinic if symptoms worsen, do not improve, or as needed.  - DG Chest 2 View; Future - benzonatate (TESSALON) 100 MG capsule; Take 1-2 capsules (100-200 mg total) by mouth 3 (three) times daily as needed for cough.  Dispense: 40 capsule; Refill: 0 - Guaifenesin (MUCINEX MAXIMUM STRENGTH) 1200 MG TB12; Take 1 tablet (1,200 mg total) by mouth every 12 (twelve) hours as needed.  Dispense: 14 tablet; Refill: 0   Benjiman CoreBrittany Yazmyne Sara, PA-C  [facility] at [facility] 08/22/2017 11:18 PM

## 2017-08-18 NOTE — Patient Instructions (Addendum)
Acute Bronchitis, Adult Acute bronchitis is when air tubes (bronchi) in the lungs suddenly get swollen. The condition can make it hard to breathe. It can also cause these symptoms:  A cough.  Coughing up clear, yellow, or green mucus.  Wheezing.  Chest congestion.  Shortness of breath.  A fever.  Body aches.  Chills.  A sore throat.  Follow these instructions at home: Medicines  Take over-the-counter and prescription medicines only as told by your doctor.  If you were prescribed an antibiotic medicine, take it as told by your doctor. Do not stop taking the antibiotic even if you start to feel better. General instructions  Rest.  Drink enough fluids to keep your pee (urine) clear or pale yellow.  Avoid smoking and secondhand smoke. If you smoke and you need help quitting, ask your doctor. Quitting will help your lungs heal faster.  Use an inhaler, cool mist vaporizer, or humidifier as told by your doctor.  Keep all follow-up visits as told by your doctor. This is important. How is this prevented? To lower your risk of getting this condition again:  Wash your hands often with soap and water. If you cannot use soap and water, use hand sanitizer.  Avoid contact with people who have cold symptoms.  Try not to touch your hands to your mouth, nose, or eyes.  Make sure to get the flu shot every year.  Contact a doctor if:  Your symptoms do not get better in 2 weeks. Get help right away if:  You cough up blood.  You have chest pain.  You have very bad shortness of breath.  You become dehydrated.  You faint (pass out) or keep feeling like you are going to pass out.  You keep throwing up (vomiting).  You have a very bad headache.  Your fever or chills gets worse. This information is not intended to replace advice given to you by your health care provider. Make sure you discuss any questions you have with your health care provider. Document Released:  11/10/2007 Document Revised: 12/31/2015 Document Reviewed: 11/12/2015 Elsevier Interactive Patient Education  2018 Elsevier Inc.     IF you received an x-ray today, you will receive an invoice from [facility]. Please contact [facility] at [phone] with questions or concerns regarding your invoice.   IF you received labwork today, you will receive an invoice from [facility]. Please contact [facility] at [phone] with questions or concerns regarding your invoice.   Our billing staff will not be able to assist you with questions regarding bills from these companies.  You will be contacted with the lab results as soon as they are available. The fastest way to get your results is to activate your My Chart account. Instructions are located on the last page of this paperwork. If you have not heard from us regarding the results in 2 weeks, please contact this office.      

## 2017-08-22 ENCOUNTER — Encounter: Payer: Self-pay | Admitting: Physician Assistant

## 2017-09-02 ENCOUNTER — Encounter: Payer: Self-pay | Admitting: Physician Assistant

## 2017-09-02 ENCOUNTER — Ambulatory Visit: Payer: PRIVATE HEALTH INSURANCE | Admitting: Physician Assistant

## 2017-09-02 VITALS — BP 112/58 | HR 128 | Temp 99.3°F | Resp 16 | Ht 65.75 in | Wt 204.0 lb

## 2017-09-02 DIAGNOSIS — R51 Headache: Secondary | ICD-10-CM | POA: Diagnosis not present

## 2017-09-02 DIAGNOSIS — R519 Headache, unspecified: Secondary | ICD-10-CM

## 2017-09-02 DIAGNOSIS — G4700 Insomnia, unspecified: Secondary | ICD-10-CM

## 2017-09-02 DIAGNOSIS — Z30019 Encounter for initial prescription of contraceptives, unspecified: Secondary | ICD-10-CM

## 2017-09-02 DIAGNOSIS — N946 Dysmenorrhea, unspecified: Secondary | ICD-10-CM

## 2017-09-02 DIAGNOSIS — F988 Other specified behavioral and emotional disorders with onset usually occurring in childhood and adolescence: Secondary | ICD-10-CM

## 2017-09-02 MED ORDER — AMPHETAMINE-DEXTROAMPHETAMINE 30 MG PO TABS: 30.0000 mg | ORAL_TABLET | Freq: Three times a day (TID) | ORAL | 0 refills | Status: DC

## 2017-09-02 MED ORDER — TRAZODONE HCL 50 MG PO TABS: 25.0000 mg | ORAL_TABLET | Freq: Every evening | ORAL | 3 refills | Status: DC | PRN

## 2017-09-02 MED ORDER — TOPIRAMATE 100 MG PO TABS: 100.0000 mg | ORAL_TABLET | Freq: Every day | ORAL | 1 refills | Status: DC

## 2017-09-02 NOTE — Assessment & Plan Note (Signed)
Trial of trazodone. Consider mirtazapine before trial of benzodiazepine or hypnotic.

## 2017-09-02 NOTE — Progress Notes (Addendum)
Patient ID: Jamie Simmons, female    DOB: April 16, 1991, 27 y.o.   MRN: [id number]  PCP: Porfirio OarJeffery, Donnika Kucher, PA-C  Chief Complaint  Patient presents with  . Headache    follow up   . ADD    Follow up     Subjective:   Presents for evaluation of ADD, headaches, insomnia and dysmenorrhea.  Adderall IR is working well for her. Thought that it might contribute to insomnia, and reduced her dose, without improved sleep. Benadryl causes RLS symptoms. Previously declined recommendation for trazodone, because her mother has taken it, but knowing tht it can help reduce anxiety is something that she is interested in. Headaches often keep her awake, too.  Due for depo-provera injection.   Review of Systems As above. No CP, SOB, dizziness. NO nausea, vomiting, diarrhea, urinary symptoms.    Patient Active Problem List   Diagnosis Date Noted  . Chronic daily headache 09/17/2014  . Dysmenorrhea 12/28/2011  . ADD (attention deficit disorder) 09/29/2011     Prior to Admission medications   Medication Sig Start Date End Date Taking? Authorizing Provider  magnesium gluconate (MAGONATE) 500 MG tablet Take 250 mg by mouth daily.   Yes [provider]  topiramate (TOPAMAX) 100 MG tablet Take 1 tablet (100 mg total) by mouth at bedtime. 03/16/17  Yes Guthrie Lemme, PA-C  acetic acid-hydrocortisone (VOSOL-HC) OTIC solution Place 3 drops into both ears 3 (three) times daily. Patient not taking: Reported on 08/18/2017 08/11/17   Doristine BosworthStallings, Zoe A, MD  amphetamine-dextroamphetamine (ADDERALL) 30 MG tablet Take 1 tablet by mouth 3 (three) times daily. 07/05/17 08/04/17  Porfirio OarJeffery, Diaz Crago, PA-C  amphetamine-dextroamphetamine (ADDERALL) 30 MG tablet Take 1 tablet by mouth 3 (three) times daily. 07/05/17 08/04/17  Porfirio OarJeffery, Samara Stankowski, PA-C  amphetamine-dextroamphetamine (ADDERALL) 30 MG tablet Take 1 tablet by mouth 3 (three) times daily. 07/05/17 08/04/17  Porfirio OarJeffery, Kalese Ensz, PA-C     No Known  Allergies     Objective:  Physical Exam  Constitutional: She is oriented to person, place, and time. She appears well-developed and well-nourished. She is active and cooperative. No distress.  BP (!) 112/58   Pulse (!) 128   Temp 99.3 F (37.4 C)   Resp 16   Ht 5' 5.75" (1.67 m)   Wt 204 lb (92.5 kg)   SpO2 99%   BMI 33.18 kg/m   HENT:  Head: Normocephalic and atraumatic.  Right Ear: Hearing normal.  Left Ear: Hearing normal.  Eyes: Conjunctivae are normal. No scleral icterus.  Neck: Normal range of motion. Neck supple. No thyromegaly present.  Cardiovascular: Normal rate, regular rhythm and normal heart sounds.  Pulses:      Radial pulses are 2+ on the right side, and 2+ on the left side.  Pulmonary/Chest: Effort normal and breath sounds normal.  Lymphadenopathy:       Head (right side): No tonsillar, no preauricular, no posterior auricular and no occipital adenopathy present.       Head (left side): No tonsillar, no preauricular, no posterior auricular and no occipital adenopathy present.    She has no cervical adenopathy.       Right: No supraclavicular adenopathy present.       Left: No supraclavicular adenopathy present.  Neurological: She is alert and oriented to person, place, and time. No sensory deficit.  Skin: Skin is warm, dry and intact. No rash noted. No cyanosis or erythema. Nails show no clubbing.  Psychiatric: She has a normal mood and affect.  Her speech is normal and behavior is normal.       Assessment & Plan:   Problem List Items Addressed This Visit    ADD (attention deficit disorder) (Chronic)    Well controlled on current treatment. Tachycardia and insomnia persist, but appear unrelated to stimulant treatment.      Relevant Medications   amphetamine-dextroamphetamine (ADDERALL) 30 MG tablet   amphetamine-dextroamphetamine (ADDERALL) 30 MG tablet   amphetamine-dextroamphetamine (ADDERALL) 30 MG tablet   Dysmenorrhea (Chronic)    Controlled.  Continue Depo-Provera Q12 weeks      Relevant Orders   Care order/instruction: (Completed)   Chronic daily headache - Primary    Likely aggravated by lack of sleep. Continue topiramate 100 mg QHS. Add trazodone to help sleep.       Relevant Medications   topiramate (TOPAMAX) 100 MG tablet   traZODone (DESYREL) 50 MG tablet   Insomnia    Trial of trazodone. Consider mirtazapine before trial of benzodiazepine or hypnotic.      Relevant Medications   traZODone (DESYREL) 50 MG tablet       Return in about 3 months (around 12/03/2017) for Depo-Provera, then 6 months for re-evalaution.   Fernande Bras, PA-C Primary Care at [facility]

## 2017-09-02 NOTE — Assessment & Plan Note (Signed)
Well controlled on current treatment. Tachycardia and insomnia persist, but appear unrelated to stimulant treatment.

## 2017-09-02 NOTE — Assessment & Plan Note (Addendum)
Likely aggravated by lack of sleep. Continue topiramate 100 mg QHS. Add trazodone to help sleep.

## 2017-09-02 NOTE — Patient Instructions (Addendum)
Let me know that you need refills of the Adderall the week of 11/16/2017.    IF you received an x-ray today, you will receive an invoice from [facility]. Please contact [facility] at [phone] with questions or concerns regarding your invoice.   IF you received labwork today, you will receive an invoice from [facility]. Please contact [facility] at [phone] with questions or concerns regarding your invoice.   Our billing staff will not be able to assist you with questions regarding bills from these companies.  You will be contacted with the lab results as soon as they are available. The fastest way to get your results is to activate your My Chart account. Instructions are located on the last page of this paperwork. If you have not heard from us regarding the results in 2 weeks, please contact this office.

## 2017-09-02 NOTE — Assessment & Plan Note (Addendum)
Controlled. Continue Depo-Provera Q12 weeks

## 2017-09-08 ENCOUNTER — Encounter: Payer: Self-pay | Admitting: Physician Assistant

## 2017-10-21 ENCOUNTER — Encounter: Payer: Self-pay | Admitting: Family Medicine

## 2017-10-26 ENCOUNTER — Encounter: Payer: Self-pay | Admitting: Family Medicine

## 2017-11-16 ENCOUNTER — Ambulatory Visit (INDEPENDENT_AMBULATORY_CARE_PROVIDER_SITE_OTHER): Payer: PRIVATE HEALTH INSURANCE | Admitting: Physician Assistant

## 2017-11-16 ENCOUNTER — Encounter: Payer: Self-pay | Admitting: Physician Assistant

## 2017-11-16 ENCOUNTER — Other Ambulatory Visit: Payer: Self-pay

## 2017-11-16 VITALS — BP 118/78 | HR 128 | Temp 98.9°F | Ht 66.0 in | Wt 199.0 lb

## 2017-11-16 DIAGNOSIS — N946 Dysmenorrhea, unspecified: Secondary | ICD-10-CM | POA: Diagnosis not present

## 2017-11-16 DIAGNOSIS — Z124 Encounter for screening for malignant neoplasm of cervix: Secondary | ICD-10-CM | POA: Diagnosis not present

## 2017-11-16 DIAGNOSIS — Z0000 Encounter for general adult medical examination without abnormal findings: Secondary | ICD-10-CM

## 2017-11-16 DIAGNOSIS — Z114 Encounter for screening for human immunodeficiency virus [HIV]: Secondary | ICD-10-CM

## 2017-11-16 DIAGNOSIS — Z23 Encounter for immunization: Secondary | ICD-10-CM

## 2017-11-16 DIAGNOSIS — Z130 Encounter for screening for diseases of the blood and blood-forming organs and certain disorders involving the immune mechanism: Secondary | ICD-10-CM | POA: Diagnosis not present

## 2017-11-16 DIAGNOSIS — Z13228 Encounter for screening for other metabolic disorders: Secondary | ICD-10-CM | POA: Diagnosis not present

## 2017-11-16 DIAGNOSIS — R51 Headache: Secondary | ICD-10-CM

## 2017-11-16 DIAGNOSIS — F988 Other specified behavioral and emotional disorders with onset usually occurring in childhood and adolescence: Secondary | ICD-10-CM

## 2017-11-16 DIAGNOSIS — Z1329 Encounter for screening for other suspected endocrine disorder: Secondary | ICD-10-CM | POA: Diagnosis not present

## 2017-11-16 DIAGNOSIS — Z1389 Encounter for screening for other disorder: Secondary | ICD-10-CM

## 2017-11-16 DIAGNOSIS — Z13220 Encounter for screening for lipoid disorders: Secondary | ICD-10-CM

## 2017-11-16 DIAGNOSIS — Z30019 Encounter for initial prescription of contraceptives, unspecified: Secondary | ICD-10-CM | POA: Diagnosis not present

## 2017-11-16 DIAGNOSIS — G4700 Insomnia, unspecified: Secondary | ICD-10-CM

## 2017-11-16 DIAGNOSIS — R519 Headache, unspecified: Secondary | ICD-10-CM

## 2017-11-16 MED ORDER — AMPHETAMINE-DEXTROAMPHETAMINE 30 MG PO TABS: 30.0000 mg | ORAL_TABLET | Freq: Three times a day (TID) | ORAL | 0 refills | Status: DC

## 2017-11-16 MED ORDER — TOPIRAMATE 100 MG PO TABS: 100.0000 mg | ORAL_TABLET | Freq: Every day | ORAL | 3 refills | Status: DC

## 2017-11-16 MED ORDER — TRAZODONE HCL 50 MG PO TABS: 25.0000 mg | ORAL_TABLET | Freq: Every evening | ORAL | 3 refills | Status: DC | PRN

## 2017-11-16 NOTE — Patient Instructions (Addendum)
Go ahead and call [facility] to schedule your next visit with me there. [phone].   IF you received an x-ray today, you will receive an invoice from [facility]. Please contact [facility] at [phone] with questions or concerns regarding your invoice.   IF you received labwork today, you will receive an invoice from [facility]. Please contact [facility] at [phone] with questions or concerns regarding your invoice.   Our billing staff will not be able to assist you with questions regarding bills from these companies.  You will be contacted with the lab results as soon as they are available. The fastest way to get your results is to activate your My Chart account. Instructions are located on the last page of this paperwork. If you have not heard from us regarding the results in 2 weeks, please contact this office.     

## 2017-11-16 NOTE — Assessment & Plan Note (Signed)
Well controlled on Adderall 30 mg TID.

## 2017-11-16 NOTE — Progress Notes (Signed)
Patient ID: Jamie Simmons, female    DOB: 1991/01/17, 27 y.o.   MRN: [id number]  PCP: Porfirio Oar, PA-C  Chief Complaint  Patient presents with  . Annual Exam    Request CPE and Depo shot    Subjective:   Presents for [employer] Visit.  In addition, she needs Depo-Provera, for contraception, and medication refills. Feels well overall, without new concerns. Has new insurance, and needs new prescriptions, as well as documentation of her visit today.  Cervical Cancer Screening: never, declines. Encouraged to obtain this screening at her earliest convenience. Discussed USPSTF recommendations. Breast Cancer Screening: Inconsistent SBE, no previous CBE, not yet a candidate for mammography. Colorectal Cancer Screening: not yet a candidate Bone Density Testing: not yet a candidate HIV Screening: declines STI Screening: declines, not currently sexually active Seasonal Influenza Vaccination: recommend annually Td/Tdap Vaccination: declines Pneumococcal Vaccination: not yet a candidate Zoster Vaccination: not yet a candidate   Review of Systems  Constitutional: Negative.   HENT: Negative.   Eyes: Negative.   Respiratory: Negative.   Cardiovascular: Negative.   Gastrointestinal: Negative.   Endocrine: Negative.   Genitourinary: Positive for menstrual problem (dysmenorhhea, treated with Depo-Provera).  Musculoskeletal: Negative.   Skin: Negative.   Allergic/Immunologic: Negative.   Neurological: Positive for headaches. Negative for dizziness, tremors, seizures, syncope, facial asymmetry, speech difficulty, weakness, light-headedness and numbness.  Hematological: Negative.   Psychiatric/Behavioral: Positive for sleep disturbance. Negative for agitation, behavioral problems, confusion, decreased concentration, dysphoric mood, hallucinations, self-injury and suicidal ideas. The patient is not nervous/anxious and is not hyperactive.    Depression screen [facility] 2/9 11/16/2017  09/02/2017 08/18/2017 06/08/2017 03/30/2017  Decreased Interest 0 0 0 0 0  Down, Depressed, Hopeless 0 0 0 0 0  PHQ - 2 Score 0 0 0 0 0     Patient Active Problem List   Diagnosis Date Noted  . Insomnia 09/02/2017  . Chronic daily headache 09/17/2014  . Dysmenorrhea 12/28/2011  . ADD (attention deficit disorder) 09/29/2011    Past Medical History:  Diagnosis Date  . Attention deficit disorder   . Migraine   . Nephrolithiasis 02/2012     Prior to Admission medications   Medication Sig Authorizing Provider  magnesium gluconate (MAGONATE) 500 MG tablet Take 250 mg by mouth daily. [provider]  topiramate (TOPAMAX) 100 MG tablet Take 1 tablet (100 mg total) by mouth at bedtime. Leotis Shames, Caige Almeda, PA-C  traZODone (DESYREL) 50 MG tablet Take 0.5-2 tablets (25-100 mg total) by mouth at bedtime as needed for sleep. Zarianna Dicarlo, PA-C  amphetamine-dextroamphetamine (ADDERALL) 30 MG tablet Take 1 tablet by mouth 3 (three) times daily. Halynn Reitano, PA-C  amphetamine-dextroamphetamine (ADDERALL) 30 MG tablet Take 1 tablet by mouth 3 (three) times daily. Chelby Salata, PA-C  amphetamine-dextroamphetamine (ADDERALL) 30 MG tablet Take 1 tablet by mouth 3 (three) times daily. Porfirio Oar, PA-C    No Known Allergies  Past Surgical History:  Procedure Laterality Date  . WISDOM TOOTH EXTRACTION      Family History  Problem Relation Age of Onset  . Drug abuse Mother   . Migraines Mother   . Hypertension Mother   . Diabetes Father   . Obesity Father     Social History   Socioeconomic History  . Marital status: Single    Spouse name: n/a  . Number of children: 0  . Years of education: 16  . Highest education level: Bachelor's degree (e.g., BA, AB, BS)  Occupational History  . Occupation:  [employer] farm manager  Social Needs  . Financial resource strain: Not on file  . Food insecurity:    Worry: Not on file    Inability: Not on file  . Transportation  needs:    Medical: Not on file    Non-medical: Not on file  Tobacco Use  . Smoking status: Never Smoker  . Smokeless tobacco: Never Used  Substance and Sexual Activity  . Alcohol use: No  . Drug use: No  . Sexual activity: Yes    Birth control/protection: Spermicide, Injection  Lifestyle  . Physical activity:    Days per week: Not on file    Minutes per session: Not on file  . Stress: Not on file  Relationships  . Social connections:    Talks on phone: Not on file    Gets together: Not on file    Attends religious service: Not on file    Active member of club or organization: Not on file    Attends meetings of clubs or organizations: Not on file    Relationship status: Not on file  Other Topics Concern  . Not on file  Social History Narrative   Lives alone.   Mother and mother's wife, grandmother, younger sister live locally.   Right-handed.   2 cups caffeine daily.   Speaks [place].        Objective:  Physical Exam  Constitutional: She is oriented to person, place, and time. She appears well-developed and well-nourished. She is active and cooperative. No distress.  BP 118/78 (BP Location: Left Arm, Patient Position: Sitting, Cuff Size: Normal)   Pulse (!) 128   Temp 98.9 F (37.2 C) (Oral)   Ht 5\' 6"  (1.676 m)   Wt 199 lb (90.3 kg)   SpO2 100%   BMI 32.12 kg/m   HENT:  Head: Normocephalic and atraumatic.  Right Ear: Hearing normal.  Left Ear: Hearing normal.  Eyes: Conjunctivae are normal. No scleral icterus.  Neck: Normal range of motion. Neck supple. No thyromegaly present.  Cardiovascular: Normal rate, regular rhythm and normal heart sounds.  Pulses:      Radial pulses are 2+ on the right side, and 2+ on the left side.  Pulmonary/Chest: Effort normal and breath sounds normal.  Lymphadenopathy:       Head (right side): No tonsillar, no preauricular, no posterior auricular and no occipital adenopathy present.       Head (left side): No tonsillar, no  preauricular, no posterior auricular and no occipital adenopathy present.    She has no cervical adenopathy.       Right: No supraclavicular adenopathy present.       Left: No supraclavicular adenopathy present.  Neurological: She is alert and oriented to person, place, and time. No sensory deficit.  Skin: Skin is warm, dry and intact. No rash noted. No cyanosis or erythema. Nails show no clubbing.  Psychiatric: She has a normal mood and affect. Her speech is normal and behavior is normal.     Wt Readings from Last 3 Encounters:  11/16/17 199 lb (90.3 kg)  09/02/17 204 lb (92.5 kg)  08/18/17 230 lb 6.4 oz (104.5 kg)     Visual Acuity Screening   Right eye Left eye Both eyes  Without correction: 20/20 20/20 20/20   With correction:        Assessment & Plan:   Problem List Items Addressed This Visit    Insomnia    Stable.  Continue trazodone.  Relevant Medications   traZODone (DESYREL) 50 MG tablet   Dysmenorrhea (Chronic)    Stable. COntinue Depo-Provera Q12 weeks      Chronic daily headache    Stable.  Continue topiramate and magnesium supplementation.      Relevant Medications   traZODone (DESYREL) 50 MG tablet   topiramate (TOPAMAX) 100 MG tablet   ADD (attention deficit disorder) (Chronic)    Well controlled on Adderall 30 mg TID.      Relevant Medications   amphetamine-dextroamphetamine (ADDERALL) 30 MG tablet (Start on 01/15/2018)   amphetamine-dextroamphetamine (ADDERALL) 30 MG tablet (Start on 12/16/2017)   amphetamine-dextroamphetamine (ADDERALL) 30 MG tablet    Other Visit Diagnoses    Annual physical exam    -  Primary   Age-appropriate health guidance provided.   Screening for deficiency anemia       Lab draw declined today.  Reviewed rationale for screening and potential risks of deferring this service.   Screening for cervical cancer       Pelvic exam declined today.  Reviewed rationale for screening and potential risks of deferring this service.    Screening for HIV (human immunodeficiency virus)       Lab draw declined today.  Reviewed rationale for screening and potential risks of deferring this service.   Screening for hyperlipidemia       Lab draw declined today.  Reviewed rationale for screening and potential risks of deferring this service.   Screening for metabolic disorder       Lab draw declined today.  Reviewed rationale for screening and potential risks of deferring this service.   Need for Tdap vaccination       Vaccine declined today.  Reviewed rationale for screening and potential risks of deferring this service.   Screening for thyroid disorder       Lab draw declined today.  Reviewed rationale for screening and potential risks of deferring this service.   Screening for blood or protein in urine       Relevant Orders   Urinalysis, dipstick only (Completed)       Return in about 3 months (around 02/16/2018) for depo-provera, and follow-up headaches, etc.   Fernande Brashelle S. Shimeka Bacot, PA-C Primary Care at [facility]

## 2017-11-16 NOTE — Assessment & Plan Note (Signed)
Stable. COntinue Depo-Provera Q12 weeks

## 2017-11-17 LAB — URINALYSIS, DIPSTICK ONLY
Bilirubin, UA: NEGATIVE
Glucose, UA: NEGATIVE
KETONES UA: NEGATIVE
NITRITE UA: NEGATIVE
PH UA: 6.5 (ref 5.0–7.5)
PROTEIN UA: NEGATIVE
RBC, UA: NEGATIVE
Specific Gravity, UA: 1.022 (ref 1.005–1.030)
Urobilinogen, Ur: 1 mg/dL (ref 0.2–1.0)

## 2017-11-18 NOTE — Assessment & Plan Note (Signed)
Stable Continue trazodone 

## 2017-11-18 NOTE — Assessment & Plan Note (Signed)
Stable.  Continue topiramate and magnesium supplementation.

## 2022-01-05 LAB — OB RESULTS CONSOLE RPR: RPR: NONREACTIVE

## 2022-01-05 LAB — OB RESULTS CONSOLE GC/CHLAMYDIA
Chlamydia: NEGATIVE
Neisseria Gonorrhea: NEGATIVE

## 2022-01-05 LAB — OB RESULTS CONSOLE RUBELLA ANTIBODY, IGM
Rubella: IMMUNE
Rubella: IMMUNE

## 2022-01-05 LAB — OB RESULTS CONSOLE HIV ANTIBODY (ROUTINE TESTING): HIV: NONREACTIVE

## 2022-01-05 LAB — OB RESULTS CONSOLE HEPATITIS B SURFACE ANTIGEN: Hepatitis B Surface Ag: NEGATIVE

## 2022-01-05 LAB — OB RESULTS CONSOLE GBS: GBS: POSITIVE

## 2022-01-05 LAB — HEPATITIS C ANTIBODY: HCV Ab: NEGATIVE

## 2022-06-07 NOTE — L&D Delivery Note (Signed)
DELIVERY NOTE  Pt complete and at +2 station with urge to push. Epidural controlling pain. Pt pushed and delivered a viable female infant in OA position. Anterior and posterior shoulders spontaneously delivered with next two pushes; body easily followed next. Infant placed on mothers abdomen and bulb suction of mouth and nose performed. Cord was then clamped and cut by MD. Cord blood obtained, 3VC. Baby had a vigorous spontaneous cry noted. Placenta then delivered at 1535 intact. Fundal massage performed and pitocin per protocol. Fundus firm. The following lacerations were noted: BL sulcal with significant edema. Repaired in routine fashion with 2-0 and 3-0 vicryl. EBL 650cc 2/2 slow ooze from edema. Mother and baby stable. Counts correct   Infant time: 1528 Gender: female, desire circ Placenta time: 1535 Apgars: 6/9 Weight: pending skin-to-skin

## 2022-07-05 ENCOUNTER — Inpatient Hospital Stay (EMERGENCY_DEPARTMENT_HOSPITAL)
Admission: AD | Admit: 2022-07-05 | Discharge: 2022-07-05 | Disposition: A | Discharge status: Home / Self Care | Payer: BC Managed Care – PPO | Source: Home / Self Care | Attending: Obstetrics | Admitting: Obstetrics

## 2022-07-05 ENCOUNTER — Encounter (HOSPITAL_COMMUNITY): Payer: Self-pay | Admitting: Obstetrics

## 2022-07-05 DIAGNOSIS — O26893 Other specified pregnancy related conditions, third trimester: Secondary | ICD-10-CM | POA: Insufficient documentation

## 2022-07-05 DIAGNOSIS — Z3689 Encounter for other specified antenatal screening: Secondary | ICD-10-CM | POA: Diagnosis not present

## 2022-07-05 DIAGNOSIS — O169 Unspecified maternal hypertension, unspecified trimester: Secondary | ICD-10-CM | POA: Diagnosis not present

## 2022-07-05 DIAGNOSIS — Z3A38 38 weeks gestation of pregnancy: Secondary | ICD-10-CM | POA: Insufficient documentation

## 2022-07-05 DIAGNOSIS — Z0371 Encounter for suspected problem with amniotic cavity and membrane ruled out: Secondary | ICD-10-CM | POA: Diagnosis not present

## 2022-07-05 DIAGNOSIS — R030 Elevated blood-pressure reading, without diagnosis of hypertension: Secondary | ICD-10-CM | POA: Insufficient documentation

## 2022-07-05 DIAGNOSIS — B3731 Acute candidiasis of vulva and vagina: Secondary | ICD-10-CM | POA: Insufficient documentation

## 2022-07-05 DIAGNOSIS — O471 False labor at or after 37 completed weeks of gestation: Secondary | ICD-10-CM | POA: Insufficient documentation

## 2022-07-05 DIAGNOSIS — Z79899 Other long term (current) drug therapy: Secondary | ICD-10-CM | POA: Insufficient documentation

## 2022-07-05 DIAGNOSIS — O98813 Other maternal infectious and parasitic diseases complicating pregnancy, third trimester: Secondary | ICD-10-CM | POA: Insufficient documentation

## 2022-07-05 LAB — WET PREP, GENITAL
Clue Cells Wet Prep HPF POC: NONE SEEN
Sperm: NONE SEEN
Trich, Wet Prep: NONE SEEN
WBC, Wet Prep HPF POC: >=10 — AB (ref <10)
Yeast Wet Prep HPF POC: NONE SEEN — AB

## 2022-07-05 LAB — POCT FERN TEST

## 2022-07-05 LAB — AMNISURE RUPTURE OF MEMBRANE (ROM) NOT AT ARMC: Amnisure ROM: NEGATIVE

## 2022-07-05 MED ORDER — FLUCONAZOLE 150 MG PO TABS: 150.0000 mg | ORAL_TABLET | Freq: Once | ORAL | 0 refills | Status: AC

## 2022-07-05 NOTE — MAU Provider Note (Signed)
History   [id number]   Chief Complaint  Patient presents with   Rupture of Membranes    HPI Jamie Simmons is a 32 y.o. female  G1P0 @38 .5 wks here with report of leaking fluid around 1am.  Leaking of fluid has continued. Pt reports irregular contractions. She denies vaginal bleeding. Last intercourse was not recent. She reports + fetal movement. All other systems negative.    No LMP recorded. Patient is pregnant.  OB History  Gravida Para Term Preterm AB Living  1            SAB IAB Ectopic Multiple Live Births               # Outcome Date GA Lbr Len/2nd Weight Sex Delivery Anes PTL Lv  1 Current             Past Medical History:  Diagnosis Date   Attention deficit disorder    Migraine    Nephrolithiasis 02/2012    Family History  Problem Relation Age of Onset   Drug abuse Mother    Migraines Mother    Hypertension Mother    Diabetes Father    Obesity Father     Social History   Socioeconomic History   Marital status: Married    Spouse name: n/a   Number of children: 0   Years of education: 16   Highest education level: Bachelor's degree (e.g., BA, AB, BS)  Occupational History   Occupation: Radio producer  Tobacco Use   Smoking status: Never   Smokeless tobacco: Never  Vaping Use   Vaping Use: Never used  Substance and Sexual Activity   Alcohol use: No   Drug use: No   Sexual activity: Yes    Birth control/protection: Abstinence  Other Topics Concern   Not on file  Social History Narrative   Lives alone.   Mother and mother's wife, grandmother, younger sister live locally.   Right-handed.   2 cups caffeine daily.   Speaks [place].   Social Determinants of Health   Financial Resource Strain: Not on file  Food Insecurity: Not on file  Transportation Needs: Not on file  Physical Activity: Not on file  Stress: Not on file  Social Connections: Not on file    No Known Allergies  No current facility-administered  medications on file prior to encounter.   Current Outpatient Medications on File Prior to Encounter  Medication Sig Dispense Refill   acetaminophen (TYLENOL) 500 MG tablet Take 1,000 mg by mouth every 6 (six) hours as needed for moderate pain.     amphetamine-dextroamphetamine (ADDERALL) 30 MG tablet Take 1 tablet by mouth 3 (three) times daily. 90 tablet 0   citalopram (CELEXA) 20 MG tablet Take 20 mg by mouth daily.     omeprazole (PRILOSEC) 20 MG capsule Take 20 mg by mouth daily.     magnesium gluconate (MAGONATE) 500 MG tablet Take 250 mg by mouth daily. (Patient not taking: Reported on 07/05/2022)     topiramate (TOPAMAX) 100 MG tablet Take 1 tablet (100 mg total) by mouth at bedtime. (Patient not taking: Reported on 07/05/2022) 90 tablet 3   traZODone (DESYREL) 50 MG tablet Take 0.5-2 tablets (25-100 mg total) by mouth at bedtime as needed for sleep. (Patient not taking: Reported on 07/05/2022) 90 tablet 3     Review of Systems  Gastrointestinal:  Negative for abdominal pain.  Genitourinary:  Positive for vaginal discharge. Negative for vaginal bleeding.  Physical Exam   Patient Vitals for the past 24 hrs:  BP Temp Temp src Pulse Resp SpO2 Height Weight  07/05/22 0615 125/87 -- -- 95 18 -- -- --  07/05/22 0609 (!) 122/92 -- -- 95 -- -- -- --  07/05/22 0603 (!) 128/96 -- -- 98 18 -- -- --  07/05/22 0445 -- -- -- -- -- 99 % -- --  07/05/22 0440 -- -- -- -- -- 98 % -- --  07/05/22 0435 -- -- -- -- -- 98 % -- --  07/05/22 0430 -- -- -- -- -- 98 % -- --  07/05/22 0425 -- -- -- -- -- 98 % -- --  07/05/22 0420 -- -- -- -- -- 97 % -- --  07/05/22 0415 -- -- -- -- -- 97 % -- --  07/05/22 0414 -- -- -- -- -- 97 % -- --  07/05/22 0410 -- -- -- -- -- 98 % -- --  07/05/22 0406 (!) 127/90 -- -- 98 -- -- -- --  07/05/22 0405 -- -- -- -- -- 97 % -- --  07/05/22 0400 -- -- -- -- -- 99 % -- --  07/05/22 0316 (!) 125/91 -- -- (!) 116 -- -- -- --  07/05/22 0252 129/84 98.8 F (37.1 C)  Oral (!) 138 19 100 % 5\' 4"  (1.626 m) 103 kg    Physical Exam Vitals and nursing note reviewed. Exam conducted with a chaperone present.  Constitutional:      General: She is not in acute distress.    Appearance: Normal appearance.  HENT:     Head: Normocephalic and atraumatic.  Pulmonary:     Effort: Pulmonary effort is normal. No respiratory distress.  Genitourinary:    Comments: SSE: no pool, thick white clumpy discharge SVE: FT/thick Musculoskeletal:        General: Normal range of motion.     Cervical back: Normal range of motion.  Skin:    General: Skin is warm and dry.  Neurological:     General: No focal deficit present.     Mental Status: She is alert and oriented to person, place, and time.  Psychiatric:        Mood and Affect: Mood normal.        Behavior: Behavior normal.   EFM: 135 bpm, mod variability, + accels, no decels Toco: irreg  Results for orders placed or performed during the hospital encounter of 07/05/22 (from the past 24 hour(s))  Wet prep, genital     Status: Abnormal   Collection Time: 07/05/22  4:00 AM   Specimen: Cervix  Result Value Ref Range   Yeast Wet Prep HPF POC NONE SEEN (A) NONE SEEN   Trich, Wet Prep NONE SEEN NONE SEEN   Clue Cells Wet Prep HPF POC NONE SEEN NONE SEEN   WBC, Wet Prep HPF POC >=10 (A) <10   Sperm NONE SEEN   POCT fern test     Status: Abnormal (Preliminary result)   Collection Time: 07/05/22  4:08 AM  Result Value Ref Range   POCT Fern Test          Amnisure rupture of membrane (rom)not at [facility]     Status: None   Collection Time: 07/05/22  4:50 AM  Result Value Ref Range   Amnisure ROM NEGATIVE    MAU Course  Procedures  MDM Labs ordered and reviewed. No signs of SROM. Will treat yeast. Just prior to discharge pt appears upset, reports regular  ctx and states "I need to get out of this bed", also states "what needs to happen", after clarification she wanted to know what needs to happen to get admitted.  Explained to patient if she is confirmed ruptured or in active labor those are indication for admission. Cervix rechecked with patients consent and unchanged. BP mildly elevated likely from pt anxious and upset. Will follow outpt. Return precautions discussed in detail. Stable for discharge home.  Assessment and Plan   1. [redacted] weeks gestation of pregnancy   2. NST (non-stress test) reactive   3. Suspected rupture of membranes not found for normal first pregnancy   4. Elevated blood pressure affecting pregnancy, antepartum   5. Yeast vaginitis    Discharge home Follow up at [facility] tomorrow as scheduled Labor precautions Return precautions  Allergies as of 07/05/2022   No Known Allergies      Medication List     STOP taking these medications    amphetamine-dextroamphetamine 30 MG tablet Commonly known as: Adderall   magnesium gluconate 500 MG tablet Commonly known as: MAGONATE   topiramate 100 MG tablet Commonly known as: TOPAMAX   traZODone 50 MG tablet Commonly known as: DESYREL       TAKE these medications    acetaminophen 500 MG tablet Commonly known as: TYLENOL Take 1,000 mg by mouth every 6 (six) hours as needed for moderate pain.   citalopram 20 MG tablet Commonly known as: CELEXA Take 20 mg by mouth daily.   fluconazole 150 MG tablet Commonly known as: DIFLUCAN Take 1 tablet (150 mg total) by mouth once for 1 dose. May repeat dose on day 4   omeprazole 20 MG capsule Commonly known as: PRILOSEC Take 20 mg by mouth daily.         Julianne Handler, CNM 07/05/2022 7:39 AM

## 2022-07-05 NOTE — MAU Note (Signed)
Pt will follow up with OB provider or return to MAU with signs of labor, decreased FM, vaginal bleeding or bloody show, s/s preeclampsia or other concerns.

## 2022-07-05 NOTE — MAU Note (Signed)
.  Jamie Simmons is a 32 y.o. at [redacted]w[redacted]d here in MAU reporting: water broke at 0100 - states was on the toilet when she noticed leaking, wiped and fluid kept coming out - wearing a depend. Fluid was clear. Denies VB. +FM. Reports has had pelvic pressure the past couple of days, but denies current pain. GBS +  Onset of complaint: 0100 Pain score: 0 Vitals:   07/05/22 0252  BP: 129/84  Pulse: (!) 138  Resp: 19  Temp: 98.8 F (37.1 C)  SpO2: 100%     FHT:137 Lab orders placed from triage:  fern

## 2022-07-05 NOTE — MAU Note (Signed)
In to review pt discharge instructions-pt reports her contractions are stronger and the bed is creating her pain to be increased. Explained definition of labor and requested that pt allow monitors to be reapplied. Pt agreeable. Diastolic BP elevated-set to automatic Q 15 min. Pt reports headache she has had has increased in intensity as well. Requesting induction of labor.

## 2022-07-05 NOTE — MAU Note (Signed)
External monitors removed for patient to ambulate in room.

## 2022-07-05 NOTE — MAU Note (Signed)
I have communicated with Marco Collie CNM and reviewed vital signs:   BP 125/87 HR 95 R 18   Vaginal exam:  Dilation: Fingertip Exam by:: Marco Collie CNM,   Also reviewed contraction pattern and that non-stress test is reactive.  It has been documented that patient is contracting every 1-6 minutes with no cervical change over 2.5 hours not indicating active labor.  Based on this report provider has given order for discharge.  A discharge order and diagnosis entered by a provider.   Labor discharge instructions reviewed with patient and husband.  Pt encouraged to po hydrate. Pt agreeable with plan and voices understanding to return to MAU.

## 2022-07-05 NOTE — MAU Note (Signed)
Marco Collie CNM in to talk with pt and husband. Discussed symptoms. Will reevaluate SVE.

## 2022-07-06 ENCOUNTER — Inpatient Hospital Stay (HOSPITAL_COMMUNITY): Payer: BC Managed Care – PPO | Admitting: Anesthesiology

## 2022-07-06 ENCOUNTER — Encounter (HOSPITAL_COMMUNITY): Payer: Self-pay | Admitting: Obstetrics and Gynecology

## 2022-07-06 ENCOUNTER — Inpatient Hospital Stay (EMERGENCY_DEPARTMENT_HOSPITAL)
Admission: AD | Admit: 2022-07-06 | Discharge: 2022-07-06 | Disposition: A | Discharge status: Home / Self Care | Payer: BC Managed Care – PPO | Source: Home / Self Care | Attending: Obstetrics and Gynecology | Admitting: Obstetrics and Gynecology

## 2022-07-06 ENCOUNTER — Inpatient Hospital Stay (HOSPITAL_COMMUNITY)
Admission: AD | Admit: 2022-07-06 | Discharge: 2022-07-10 | DRG: 805 | Disposition: A | Discharge status: Home / Self Care | Payer: BC Managed Care – PPO | Attending: Obstetrics and Gynecology | Admitting: Obstetrics and Gynecology

## 2022-07-06 DIAGNOSIS — O479 False labor, unspecified: Principal | ICD-10-CM | POA: Diagnosis present

## 2022-07-06 DIAGNOSIS — O99214 Obesity complicating childbirth: Secondary | ICD-10-CM | POA: Diagnosis present

## 2022-07-06 DIAGNOSIS — O99893 Other specified diseases and conditions complicating puerperium: Secondary | ICD-10-CM | POA: Diagnosis present

## 2022-07-06 DIAGNOSIS — Z87442 Personal history of urinary calculi: Secondary | ICD-10-CM | POA: Diagnosis not present

## 2022-07-06 DIAGNOSIS — O411230 Chorioamnionitis, third trimester, not applicable or unspecified: Secondary | ICD-10-CM | POA: Diagnosis present

## 2022-07-06 DIAGNOSIS — O872 Hemorrhoids in the puerperium: Secondary | ICD-10-CM | POA: Diagnosis not present

## 2022-07-06 DIAGNOSIS — O99824 Streptococcus B carrier state complicating childbirth: Secondary | ICD-10-CM | POA: Diagnosis present

## 2022-07-06 DIAGNOSIS — Z3A38 38 weeks gestation of pregnancy: Secondary | ICD-10-CM

## 2022-07-06 DIAGNOSIS — O471 False labor at or after 37 completed weeks of gestation: Secondary | ICD-10-CM

## 2022-07-06 DIAGNOSIS — Z79899 Other long term (current) drug therapy: Secondary | ICD-10-CM | POA: Diagnosis not present

## 2022-07-06 DIAGNOSIS — O9902 Anemia complicating childbirth: Secondary | ICD-10-CM | POA: Diagnosis present

## 2022-07-06 DIAGNOSIS — Z3403 Encounter for supervision of normal first pregnancy, third trimester: Secondary | ICD-10-CM | POA: Insufficient documentation

## 2022-07-06 DIAGNOSIS — O26893 Other specified pregnancy related conditions, third trimester: Secondary | ICD-10-CM | POA: Diagnosis present

## 2022-07-06 DIAGNOSIS — R339 Retention of urine, unspecified: Secondary | ICD-10-CM | POA: Diagnosis present

## 2022-07-06 LAB — CBC
HCT: 41.7 % (ref 36.0–46.0)
Hemoglobin: 13.7 g/dL (ref 12.0–15.0)
MCH: 29.5 pg (ref 26.0–34.0)
MCHC: 32.9 g/dL (ref 30.0–36.0)
MCV: 89.7 fL (ref 80.0–100.0)
Platelets: 272 10*3/uL (ref 150–400)
RBC: 4.65 MIL/uL (ref 3.87–5.11)
RDW: 14.6 % (ref 11.5–15.5)
WBC: 12.5 10*3/uL — ABNORMAL HIGH (ref 4.0–10.5)
nRBC: 0 % (ref 0.0–0.2)

## 2022-07-06 LAB — TYPE AND SCREEN
ABO/RH(D): A POS
Antibody Screen: NEGATIVE

## 2022-07-06 LAB — HIV ANTIBODY (ROUTINE TESTING W REFLEX): HIV Screen 4th Generation wRfx: NONREACTIVE

## 2022-07-06 MED ORDER — LIDOCAINE HCL (PF) 1 % IJ SOLN: 30.0000 mL | INTRAMUSCULAR | Status: DC | PRN

## 2022-07-06 MED ORDER — EPHEDRINE 5 MG/ML INJ: 10.0000 mg | INTRAVENOUS | Status: DC | PRN

## 2022-07-06 MED ORDER — PHENYLEPHRINE 80 MCG/ML (10ML) SYRINGE FOR IV PUSH (FOR BLOOD PRESSURE SUPPORT): 80.0000 ug | PREFILLED_SYRINGE | INTRAVENOUS | Status: DC | PRN

## 2022-07-06 MED ORDER — OXYTOCIN-SODIUM CHLORIDE 30-0.9 UT/500ML-% IV SOLN
2.5000 [IU]/h | INTRAVENOUS | Status: DC
  Administered 2022-07-07: 2.5 [IU]/h via INTRAVENOUS
  Filled 2022-07-06: qty 500

## 2022-07-06 MED ORDER — ACETAMINOPHEN 325 MG PO TABS: 650.0000 mg | ORAL_TABLET | ORAL | Status: DC | PRN

## 2022-07-06 MED ORDER — PENICILLIN G POT IN DEXTROSE 60000 UNIT/ML IV SOLN
3.0000 10*6.[IU] | INTRAVENOUS | Status: DC
  Administered 2022-07-06 – 2022-07-07 (×5): 3 10*6.[IU] via INTRAVENOUS
  Filled 2022-07-06 (×8): qty 50

## 2022-07-06 MED ORDER — LACTATED RINGERS IV SOLN
500.0000 mL | Freq: Once | INTRAVENOUS | Status: AC
  Administered 2022-07-06: 500 mL via INTRAVENOUS

## 2022-07-06 MED ORDER — LACTATED RINGERS IV SOLN: 500.0000 mL | Freq: Once | INTRAVENOUS | Status: DC

## 2022-07-06 MED ORDER — OXYCODONE-ACETAMINOPHEN 5-325 MG PO TABS: 2.0000 | ORAL_TABLET | ORAL | Status: DC | PRN

## 2022-07-06 MED ORDER — OXYTOCIN BOLUS FROM INFUSION
333.0000 mL | Freq: Once | INTRAVENOUS | Status: AC
  Administered 2022-07-07: 600 mL/h via INTRAVENOUS

## 2022-07-06 MED ORDER — ONDANSETRON HCL 4 MG/2ML IJ SOLN: 4.0000 mg | Freq: Four times a day (QID) | INTRAMUSCULAR | Status: DC | PRN

## 2022-07-06 MED ORDER — LIDOCAINE HCL (PF) 1 % IJ SOLN
INTRAMUSCULAR | Status: DC | PRN
  Administered 2022-07-06: 3 mL via EPIDURAL
  Administered 2022-07-06: 2 mL via EPIDURAL
  Administered 2022-07-06: 5 mL via EPIDURAL

## 2022-07-06 MED ORDER — LACTATED RINGERS IV SOLN
500.0000 mL | INTRAVENOUS | Status: DC | PRN
  Administered 2022-07-06: 1000 mL via INTRAVENOUS
  Administered 2022-07-07 (×2): 500 mL via INTRAVENOUS

## 2022-07-06 MED ORDER — MORPHINE SULFATE (PF) 10 MG/ML IV SOLN
10.0000 mg | Freq: Once | INTRAVENOUS | Status: AC
  Administered 2022-07-06: 10 mg via INTRAMUSCULAR
  Filled 2022-07-06: qty 1

## 2022-07-06 MED ORDER — LACTATED RINGERS IV SOLN: INTRAVENOUS | Status: DC

## 2022-07-06 MED ORDER — FENTANYL CITRATE (PF) 100 MCG/2ML IJ SOLN
50.0000 ug | INTRAMUSCULAR | Status: DC | PRN
  Administered 2022-07-06: 100 ug via INTRAVENOUS
  Filled 2022-07-06: qty 2

## 2022-07-06 MED ORDER — OXYCODONE-ACETAMINOPHEN 5-325 MG PO TABS: 1.0000 | ORAL_TABLET | ORAL | Status: DC | PRN

## 2022-07-06 MED ORDER — DIPHENHYDRAMINE HCL 50 MG/ML IJ SOLN: 12.5000 mg | INTRAMUSCULAR | Status: DC | PRN

## 2022-07-06 MED ORDER — FENTANYL-BUPIVACAINE-NACL 0.5-0.125-0.9 MG/250ML-% EP SOLN
12.0000 mL/h | EPIDURAL | Status: DC | PRN
  Administered 2022-07-06: 12 mL/h via EPIDURAL
  Filled 2022-07-06 (×2): qty 250

## 2022-07-06 MED ORDER — SOD CITRATE-CITRIC ACID 500-334 MG/5ML PO SOLN
30.0000 mL | ORAL | Status: DC | PRN
  Administered 2022-07-07: 30 mL via ORAL
  Filled 2022-07-06: qty 30

## 2022-07-06 MED ORDER — OXYTOCIN-SODIUM CHLORIDE 30-0.9 UT/500ML-% IV SOLN
1.0000 m[IU]/min | INTRAVENOUS | Status: DC
  Administered 2022-07-06: 2 m[IU]/min via INTRAVENOUS
  Filled 2022-07-06: qty 500

## 2022-07-06 MED ORDER — SODIUM CHLORIDE 0.9 % IV SOLN
5.0000 10*6.[IU] | Freq: Once | INTRAVENOUS | Status: AC
  Administered 2022-07-06: 5 10*6.[IU] via INTRAVENOUS
  Filled 2022-07-06: qty 5

## 2022-07-06 NOTE — Anesthesia Procedure Notes (Signed)
Epidural Patient location during procedure: OB Start time: 07/06/2022 6:20 PM End time: 07/06/2022 6:28 PM  Staffing Anesthesiologist: Suzette Battiest, MD Performed: anesthesiologist   Preanesthetic Checklist Completed: patient identified, IV checked, site marked, risks and benefits discussed, surgical consent, monitors and equipment checked, pre-op evaluation and timeout performed  Epidural Patient position: sitting Prep: DuraPrep and site prepped and draped Patient monitoring: continuous pulse ox and blood pressure Approach: midline Location: L4-L5 Injection technique: LOR air  Needle:  Needle type: Tuohy  Needle gauge: 17 G Needle length: 9 cm and 9 Needle insertion depth: 8 cm Catheter type: closed end flexible Catheter size: 19 Gauge Catheter at skin depth: 14 cm Test dose: negative  Assessment Events: blood not aspirated, no cerebrospinal fluid, injection not painful, no injection resistance, no paresthesia and negative IV test

## 2022-07-06 NOTE — MAU Provider Note (Signed)
Ms. Jamie Simmons is a G1P0 at [redacted]w[redacted]d seen in MAU for labor. RN labor check, seen by provider per request of RN d/t unable to reach cervix. SVE by CNM @ 0523 3/90/0/vtx. Prior cervical check on 07/05/2022 was 0.5 cm. Instructions given to RN to call on-call OB for further orders. OB on-call gave verbal orders to RN for Morphine 10 mg IM. Patient was able to rest/sleep x 2 hours. Patient reports pain went down from a 8/10 to 3/10 after receiving medication.   SVE by RN @ 0730 Dilation: 3 Effacement (%): 90 Station: 0 Presentation: Vertex Exam by:: Herb Grays, RN   NST - FHR: 120 bpm / moderate variability / accels present / decels absent / TOCO: irregular every 6-7 mins   Plan:  D/C home with labor precautions Keep scheduled appt with [facility] on 07/06/2022 Patient verbalized an understanding of the plan of care and agrees.   Laury Deep, CNM  07/06/2022 7:44 AM

## 2022-07-06 NOTE — Anesthesia Preprocedure Evaluation (Signed)
Anesthesia Evaluation  Patient identified by MRN, date of birth, ID band Patient awake    Reviewed: Allergy & Precautions, Patient's Chart, lab work & pertinent test results  Airway Mallampati: II       Dental   Pulmonary neg pulmonary ROS   Pulmonary exam normal        Cardiovascular negative cardio ROS Normal cardiovascular exam     Neuro/Psych  Headaches    GI/Hepatic negative GI ROS, Neg liver ROS,,,  Endo/Other  negative endocrine ROS    Renal/GU negative Renal ROS     Musculoskeletal   Abdominal   Peds  Hematology negative hematology ROS (+)   Anesthesia Other Findings   Reproductive/Obstetrics (+) Pregnancy                             Anesthesia Physical Anesthesia Plan  ASA: 2  Anesthesia Plan: Epidural   Post-op Pain Management:    Induction:   PONV Risk Score and Plan: 2 and Treatment may vary due to age or medical condition  Airway Management Planned: Natural Airway  Additional Equipment:   Intra-op Plan:   Post-operative Plan:   Informed Consent: I have reviewed the patients History and Physical, chart, labs and discussed the procedure including the risks, benefits and alternatives for the proposed anesthesia with the patient or authorized representative who has indicated his/her understanding and acceptance.       Plan Discussed with:   Anesthesia Plan Comments:        Anesthesia Quick Evaluation

## 2022-07-06 NOTE — Plan of Care (Signed)
  Problem: Education: Goal: Knowledge of Childbirth will improve Outcome: Progressing Goal: Ability to make informed decisions regarding treatment and plan of care will improve Outcome: Progressing Goal: Ability to state and carry out methods to decrease the pain will improve Outcome: Progressing Goal: Individualized Educational Video(s) Outcome: Progressing   Problem: Coping: Goal: Ability to verbalize concerns and feelings about labor and delivery will improve Outcome: Progressing   Problem: Life Cycle: Goal: Ability to make normal progression through stages of labor will improve Outcome: Progressing Goal: Ability to effectively push during vaginal delivery will improve Outcome: Progressing   Problem: Role Relationship: Goal: Will demonstrate positive interactions with the child Outcome: Progressing   Problem: Safety: Goal: Risk of complications during labor and delivery will decrease Outcome: Progressing   Problem: Education: Goal: Knowledge of General Education information will improve Description: Including pain rating scale, medication(s)/side effects and non-pharmacologic comfort measures Outcome: Progressing   Problem: Health Behavior/Discharge Planning: Goal: Ability to manage health-related needs will improve Outcome: Progressing   Problem: Clinical Measurements: Goal: Ability to maintain clinical measurements within normal limits will improve Outcome: Progressing Goal: Will remain free from infection Outcome: Progressing Goal: Diagnostic test results will improve Outcome: Progressing Goal: Respiratory complications will improve Outcome: Progressing Goal: Cardiovascular complication will be avoided Outcome: Progressing   Problem: Activity: Goal: Risk for activity intolerance will decrease Outcome: Progressing   Problem: Nutrition: Goal: Adequate nutrition will be maintained Outcome: Progressing   Problem: Coping: Goal: Level of anxiety will  decrease Outcome: Progressing   Problem: Elimination: Goal: Will not experience complications related to bowel motility Outcome: Progressing Goal: Will not experience complications related to urinary retention Outcome: Progressing   Problem: Pain Managment: Goal: General experience of comfort will improve Outcome: Progressing   Problem: Safety: Goal: Ability to remain free from injury will improve Outcome: Progressing   Problem: Skin Integrity: Goal: Risk for impaired skin integrity will decrease Outcome: Progressing   Problem: Pain Management: Goal: Relief or control of pain from uterine contractions will improve Outcome: Completed/Met Note: Epidural in place

## 2022-07-06 NOTE — MAU Note (Signed)
Jamie Simmons is a 32 y.o. at [redacted]w[redacted]d here in MAU reporting: CTX back to back. +FM. Pt reports bloody show. Pt denies LOF. Pt reports ctx for the past 4 days. Pt was here today and was 3cm.   Onset of complaint: 07/06/22 Pain score: 10/10 abdomen  There were no vitals filed for this visit.   FHT:135 Lab orders placed from triage:  mau labor

## 2022-07-06 NOTE — Discharge Instructions (Signed)
2/3-1-1 Rule Go to MAU for painful contractions every 2-3 minutes, lasting 1 minute each for 1.5 hours.

## 2022-07-06 NOTE — MAU Note (Signed)
I have communicated with Laury Deep, CNM and Dr. Brien Mates and reviewed vital signs:  Vitals:   07/06/22 0454 07/06/22 0556  BP: 119/82 118/78  Pulse: (!) 106 78  Resp: 18 17  Temp: 97.6 F (36.4 C)   SpO2: 96%     Vaginal exam:  Dilation: 3 Effacement (%): 90 Station: 0 Presentation: Vertex Exam by:: Herb Grays, RN,   Also reviewed contraction pattern and that non-stress test is reactive.  It has been documented that patient is contracting every 1-7 minutes with no cervical change over 2 hours not indicating active labor.  Patient denies any other complaints.  Based on this report provider has given order for discharge.  A discharge order and diagnosis entered by a provider.   Labor discharge instructions reviewed with patient. Patient verbalized understanding of when to return to the hospital and to keep OB appointment that is scheduled for this afternoon.

## 2022-07-06 NOTE — Progress Notes (Signed)
OB Progress Note  S: Pt sleeping with epidural   O: Today's Vitals   07/06/22 2030 07/06/22 2100 07/06/22 2133 07/06/22 2200  BP: 128/81 127/79 (!) 109/58 107/66  Pulse: 74 86 75 80  Resp: 12 14 16 14   Temp:      TempSrc:      SpO2:      PainSc:       There is no height or weight on file to calculate BMI.  SVE 5/100/-1  FHR: 120bpm, mod variability + accels, no decels Toco: ctx q 7-8 mins   A/P:  31Y G1P0 @ [redacted]w[redacted]d, labor Fetal wellbeing: cat I tracing Labor: still 5cm and contractions have spaced after epidural. Recommend augmentation with AROM and pitocin. Patient declines AROM at this time but amenable to pitocin. Will start 2x2 and plan AROM when patient amenable Pain control: epidural GBS+: penicillin     M. Brien Mates, MD 07/06/22 10:14 PM

## 2022-07-06 NOTE — MAU Note (Signed)
.  Jamie Simmons is a 32 y.o. at [redacted]w[redacted]d here in MAU reporting: CTX since 0000, been having them on and off since Saturday and now more consistently. Pt reports a chronic HA, no relief with Tylenol. Pt denies VB, DFM, LOF, other PIH s/s, and complications in the pregnancy.   Onset of complaint: 0000 Pain score: 8/10 Vitals:   07/06/22 0454  BP: 119/82  Pulse: (!) 106  Resp: 18  Temp: 97.6 F (36.4 C)  SpO2: 96%     FHT:140 Lab orders placed from triage:

## 2022-07-06 NOTE — H&P (Signed)
Jamie Simmons is a 32 y.o. female G1P0 [redacted]w[redacted]d presenting for contractions. Has been in protracted latent phase for last 3 days. Presented to MAU yesterday (exam 0.5cm/thick) without cervical change on recheck. She returned early this morning and exam 3/90/0 without cervical change on recheck. She received IM morphine this AM and contractions spaced. She returned this evening and exam in MAU was 5/100/0. No LOF or bleeding. Good fetal movement.   Since arriving to L&D, patient has received epidural and is resting in bed.  Pregnancy c/b: Obesity: initial BMI 36. Growth scan at 32 weeks: 56% (4lb5oz) GBS+   OB History     Gravida  1   Para      Term      Preterm      AB      Living         SAB      IAB      Ectopic      Multiple      Live Births             Past Medical History:  Diagnosis Date   Attention deficit disorder    Migraine    Nephrolithiasis 02/06/2012   Past Surgical History:  Procedure Laterality Date   WISDOM TOOTH EXTRACTION     Family History: family history includes Diabetes in her father; Drug abuse in her mother; Hypertension in her mother; Migraines in her mother; Obesity in her father. Social History:  reports that she has never smoked. She has never used smokeless tobacco. She reports that she does not drink alcohol and does not use drugs.     Maternal Diabetes: No Genetic Screening: Normal Maternal Ultrasounds/Referrals: Normal Fetal Ultrasounds or other Referrals:  None Maternal Substance Abuse:  No Significant Maternal Medications:  None Significant Maternal Lab Results:  Group B Strep positive Other Comments:  None  Review of Systems Per HPI Exam Physical Exam  Dilation: 5 Effacement (%): 100 Station: 0 Exam by:: K.Wilson,RN Blood pressure 131/81, pulse 81, temperature 97.8 F (36.6 C), temperature source Oral, resp. rate 16, SpO2 100 %. Gen: NAD, resting comfortably CVS: normal pulses Lungs: nonlabored  respirations Abd: Gravid abdomen Ext: no calf edema or tenderness  Fetal testing: 125bpm, mod variability, + accels, no decels Toco: ctx q 3-5 mins  Prenatal labs: ABO, Rh:  --/--/A POS (01/30 1735) Antibody: NEG (01/30 1735) Rubella: Immune, Immune (08/01 0000) RPR: Nonreactive (08/01 0000)  HBsAg: Negative (08/01 0000)  HIV: Non Reactive (01/30 1735)  GBS: Positive/-- (08/01 0000)   Assessment/Plan: 31Y G1P0 @ [redacted]w[redacted]d, labor Fetal wellbeing: cat I tracing Labor: progressed from 3 to 5cm. Plan AROM once adequate GBS prophylaxis Pain control: epidural GBS+: penicillin   Rowland Lathe 07/06/2022, 8:23 PM

## 2022-07-07 ENCOUNTER — Encounter (HOSPITAL_COMMUNITY): Payer: Self-pay | Admitting: Obstetrics and Gynecology

## 2022-07-07 LAB — RPR: RPR Ser Ql: NONREACTIVE

## 2022-07-07 MED ORDER — ACETAMINOPHEN 325 MG PO TABS
650.0000 mg | ORAL_TABLET | ORAL | Status: DC | PRN
  Administered 2022-07-07 – 2022-07-10 (×8): 650 mg via ORAL
  Filled 2022-07-07 (×8): qty 2

## 2022-07-07 MED ORDER — SENNOSIDES-DOCUSATE SODIUM 8.6-50 MG PO TABS
2.0000 | ORAL_TABLET | Freq: Every day | ORAL | Status: DC
  Administered 2022-07-08 – 2022-07-10 (×3): 2 via ORAL
  Filled 2022-07-07 (×3): qty 2

## 2022-07-07 MED ORDER — IBUPROFEN 600 MG PO TABS
600.0000 mg | ORAL_TABLET | Freq: Four times a day (QID) | ORAL | Status: DC
  Administered 2022-07-07 – 2022-07-10 (×10): 600 mg via ORAL
  Filled 2022-07-07 (×11): qty 1

## 2022-07-07 MED ORDER — FAMOTIDINE IN NACL 20-0.9 MG/50ML-% IV SOLN
20.0000 mg | Freq: Once | INTRAVENOUS | Status: AC | PRN
  Administered 2022-07-07: 20 mg via INTRAVENOUS
  Filled 2022-07-07: qty 50

## 2022-07-07 MED ORDER — ONDANSETRON HCL 4 MG/2ML IJ SOLN
4.0000 mg | INTRAMUSCULAR | Status: DC | PRN
  Administered 2022-07-07: 4 mg via INTRAVENOUS
  Filled 2022-07-07: qty 2

## 2022-07-07 MED ORDER — ACETAMINOPHEN 500 MG PO TABS
1000.0000 mg | ORAL_TABLET | Freq: Once | ORAL | Status: AC | PRN
  Administered 2022-07-07: 1000 mg via ORAL
  Filled 2022-07-07: qty 2

## 2022-07-07 MED ORDER — TETANUS-DIPHTH-ACELL PERTUSSIS 5-2.5-18.5 LF-MCG/0.5 IM SUSY: 0.5000 mL | PREFILLED_SYRINGE | Freq: Once | INTRAMUSCULAR | Status: DC

## 2022-07-07 MED ORDER — CITALOPRAM HYDROBROMIDE 20 MG PO TABS
20.0000 mg | ORAL_TABLET | Freq: Every day | ORAL | Status: DC
  Administered 2022-07-07 – 2022-07-10 (×4): 20 mg via ORAL
  Filled 2022-07-07 (×4): qty 1

## 2022-07-07 MED ORDER — DIPHENHYDRAMINE HCL 25 MG PO CAPS: 25.0000 mg | ORAL_CAPSULE | Freq: Four times a day (QID) | ORAL | Status: DC | PRN

## 2022-07-07 MED ORDER — SODIUM CHLORIDE 0.9 % IV SOLN
3.0000 g | Freq: Four times a day (QID) | INTRAVENOUS | Status: AC
  Administered 2022-07-07 – 2022-07-08 (×4): 3 g via INTRAVENOUS
  Filled 2022-07-07 (×5): qty 8

## 2022-07-07 MED ORDER — SIMETHICONE 80 MG PO CHEW: 80.0000 mg | CHEWABLE_TABLET | ORAL | Status: DC | PRN

## 2022-07-07 MED ORDER — PRENATAL MULTIVITAMIN CH
1.0000 | ORAL_TABLET | Freq: Every day | ORAL | Status: DC
  Administered 2022-07-08 – 2022-07-10 (×3): 1 via ORAL
  Filled 2022-07-07 (×3): qty 1

## 2022-07-07 MED ORDER — DIBUCAINE (PERIANAL) 1 % EX OINT
1.0000 | TOPICAL_OINTMENT | CUTANEOUS | Status: DC | PRN
  Administered 2022-07-09: 1 via RECTAL
  Filled 2022-07-07: qty 28

## 2022-07-07 MED ORDER — BENZOCAINE-MENTHOL 20-0.5 % EX AERO
1.0000 | INHALATION_SPRAY | CUTANEOUS | Status: DC | PRN
  Administered 2022-07-07: 1 via TOPICAL
  Filled 2022-07-07: qty 56

## 2022-07-07 MED ORDER — COCONUT OIL OIL: 1.0000 | TOPICAL_OIL | Status: DC | PRN

## 2022-07-07 MED ORDER — WITCH HAZEL-GLYCERIN EX PADS
1.0000 | MEDICATED_PAD | CUTANEOUS | Status: DC | PRN
  Administered 2022-07-09: 1 via TOPICAL

## 2022-07-07 MED ORDER — OXYCODONE HCL 5 MG PO TABS
5.0000 mg | ORAL_TABLET | Freq: Four times a day (QID) | ORAL | Status: DC | PRN
  Administered 2022-07-07: 5 mg via ORAL
  Filled 2022-07-07: qty 1

## 2022-07-07 MED ORDER — ZOLPIDEM TARTRATE 5 MG PO TABS: 5.0000 mg | ORAL_TABLET | Freq: Every evening | ORAL | Status: DC | PRN

## 2022-07-07 MED ORDER — BUPIVACAINE HCL (PF) 0.25 % IJ SOLN
INTRAMUSCULAR | Status: DC | PRN
  Administered 2022-07-07: 3 mL via EPIDURAL
  Administered 2022-07-07: 5 mL via EPIDURAL

## 2022-07-07 MED ORDER — ONDANSETRON HCL 4 MG PO TABS: 4.0000 mg | ORAL_TABLET | ORAL | Status: DC | PRN

## 2022-07-07 MED ORDER — ONDANSETRON 4 MG PO TBDP: 4.0000 mg | ORAL_TABLET | Freq: Two times a day (BID) | ORAL | Status: DC | PRN

## 2022-07-07 NOTE — Anesthesia Postprocedure Evaluation (Signed)
Anesthesia Post Note  Patient: Jamie Simmons  Procedure(s) Performed: AN AD HOC LABOR EPIDURAL     Patient location during evaluation: Mother Baby Anesthesia Type: Epidural Level of consciousness: awake and alert Pain management: pain level controlled Vital Signs Assessment: post-procedure vital signs reviewed and stable Respiratory status: spontaneous breathing, nonlabored ventilation and respiratory function stable Cardiovascular status: stable Postop Assessment: no headache, no backache and epidural receding Anesthetic complications: no   No notable events documented.  Last Vitals:  Vitals:   07/07/22 1815 07/07/22 1903  BP: 131/76 127/85  Pulse: (!) 111 (!) 105  Resp: 16 18  Temp:  37.1 C  SpO2: 100% 97%    Last Pain:  Vitals:   07/07/22 1903  TempSrc: Oral  PainSc:    Pain Goal:                   [employer]

## 2022-07-07 NOTE — Progress Notes (Signed)
Patient with tachycardia in 120s and temp 100.3F, persistent. Will give 24hr Unasyn 3g q6hr, order placed. Fundus NTTP BP 125/88   Pulse (!) 120   Temp 100.1 F (37.8 C) (Axillary)   Resp 14   SpO2 100%   Breastfeeding Unknown

## 2022-07-07 NOTE — Lactation Note (Signed)
This note was copied from a baby's chart. Lactation Consultation Note  Patient Name: Jamie Simmons [id number] Date: 07/07/2022 Reason for consult: Initial assessment;1st time breastfeeding;Term Age:31 hours P1, term female infant, Birth Parent briefly latched infant on her left breast using the football hold position, infant BF for 4 minutes then became sleepy at the breast. Birth Parent was shown hand expression, LC used breast model to teach hand expression and Birth Parent expressed 3 mls that was spoon fed to infant. LC discussed continue to work on latching infant at the breast according to hunger cues, on demand, 8 to 12+ times within 24 hours, STS and continue to ask RN/LC for latch assistance if needed. Birth Parent knows if infant is still sleepy and doesn't latch after doing STS, to hand express and give infant back her EBM by spoon. LC discussed infant's input and output. LC discussed importance of maternal rest, diet and hydration. Mom made aware of O/P services, breastfeeding support groups, community resources, and our phone # for post-discharge questions.   Maternal Data Has patient been taught Hand Expression?: Yes Does the patient have breastfeeding experience prior to this delivery?: No  Feeding Mother's Current Feeding Choice: Breast Milk  LATCH Score Latch: Grasps breast easily, tongue down, lips flanged, rhythmical sucking. (Infant breifly latched for 4 minutes, became sleepy at the breast.)  Audible Swallowing: A few with stimulation  Type of Nipple: Everted at rest and after stimulation  Comfort (Breast/Nipple): Soft / non-tender  Hold (Positioning): Assistance needed to correctly position infant at breast and maintain latch.  LATCH Score: 8   Lactation Tools Discussed/Used    Interventions Interventions: Breast feeding basics reviewed;Adjust position;Assisted with latch;Support pillows;Skin to skin;Position options;Hand express;Breast compression;Education;LC  Services brochure  Discharge Pump: Personal (Birth Parent ordered DEBP with insurance not arrived yet.)  Consult Status Consult Status: Follow-up Date: 07/08/22 Follow-up type: In-patient    Eulis Canner 07/07/2022, 7:38 PM

## 2022-07-07 NOTE — Progress Notes (Signed)
OB Progress Note  S: pt comfortable with epidural   O: Today's Vitals   07/07/22 0100 07/07/22 0130 07/07/22 0151 07/07/22 0200  BP: 121/64 126/77  107/66  Pulse: 83 89  96  Resp: 14 18    Temp:      TempSrc:      SpO2:      PainSc:   3     There is no height or weight on file to calculate BMI.  SVE 7/100/0 AROM clear fluid  FHR: 140bpm, mod variability, + accels, no decels Toco: ctx q 2-4 mins   A/P:31Y G1P0 @ [redacted]w[redacted]d, labor Fetal wellbeing: cat I tracing Labor: amenable to AROM now, continue pitocin, currently at 19mu/min. Anticipate SVD Pain control: epidural GBS+: penicillin     M. Brien Mates, MD 07/07/22 2:30 AM

## 2022-07-07 NOTE — Progress Notes (Signed)
Labor Note  S: comfortable on left side, occ pressure but nothing persistent. Had HA, requests Tylenol  O: BP 132/84   Pulse (!) 118   Temp 100.1 F (37.8 C) (Axillary)   Resp 14   SpO2 100%  CE: complete/0, palpates OA, mild caput to +1. Feels pressure with exam FHR: Baseline 145, +accels, -decels, min to mod variability TOCO q1-3, pitocin at 83mU/min  A/P: This is a 33 y.o. G1P0 at [redacted]w[redacted]d  admitted in labor FWB: cat 1 MWB: s/p epidural. Axillary temp elevate but not warm on exam, no fetal tachycardia. Patient given PO Tylenol for HA, will continue to keep close eye. AROM at 0230 Labor course: Anticipate active mgmt of second stage within hour  Reassess within hour to being pushing   Anticipate SVD

## 2022-07-07 NOTE — Progress Notes (Signed)
Update - patient began pushing around 1000. Took a break at 1140 for left sided pain that has now improved. Restarted pushing at 1215, therefore approx total time is 2hrs but with a break.  BP 125/83   Pulse 77   Temp 98.8 F (37.1 C) (Oral)   Resp 14   SpO2 100%  Afebrile at this time. TOCO q2-86m. Assessed pushing during contraction. Station now +1-2 with slight caput at +3 however definite change since my previous check. Good effort noted during 3 contractions. Reassured patient of appropriate effort and briefly reviewed path/cardinal movements of labor with patient. Good understanding appreciated. Continue pushing at this time

## 2022-07-08 LAB — CBC
HCT: 30.4 % — ABNORMAL LOW (ref 36.0–46.0)
Hemoglobin: 9.9 g/dL — ABNORMAL LOW (ref 12.0–15.0)
MCH: 29.6 pg (ref 26.0–34.0)
MCHC: 32.6 g/dL (ref 30.0–36.0)
MCV: 90.7 fL (ref 80.0–100.0)
Platelets: 224 10*3/uL (ref 150–400)
RBC: 3.35 MIL/uL — ABNORMAL LOW (ref 3.87–5.11)
RDW: 14.6 % (ref 11.5–15.5)
WBC: 16.4 10*3/uL — ABNORMAL HIGH (ref 4.0–10.5)
nRBC: 0 % (ref 0.0–0.2)

## 2022-07-08 NOTE — Progress Notes (Signed)
Pt unable to void after multiple attempts. Pt asking RN if she can have catheter again, RN calling provider.

## 2022-07-08 NOTE — Progress Notes (Signed)
Patient unable to void after multiple attempts. Patient felt like bladder was full and refused bladder scan prior to intermittent catheter. Cath done and 444mL drained from bladder. Encouraged patient to try to void every couple of hours.

## 2022-07-08 NOTE — Progress Notes (Signed)
Patient is eating, ambulating, not able to void, s/p straight cath x2.  Pain control is good. Appropriate lochia. No complaints, no CP/SOB.  Vitals:   07/07/22 1903 07/08/22 0127 07/08/22 0620 07/08/22 1000  BP: 127/85 99/68 102/71 99/65  Pulse: (!) 105 96 90 88  Resp: 18 16 16 16   Temp: 98.8 F (37.1 C) 98.4 F (36.9 C) 98.2 F (36.8 C) 98.2 F (36.8 C)  TempSrc: Oral Oral Oral Oral  SpO2: 97% 98% 99% 99%    Fundus firm Abd: soft nontender Ext: no calf tenderness  Lab Results  Component Value Date   WBC 16.4 (H) 07/08/2022   HGB 9.9 (L) 07/08/2022   HCT 30.4 (L) 07/08/2022   MCV 90.7 07/08/2022   PLT 224 07/08/2022    --/--/A POS (01/30 1735)  A/P Post partum day #1. Chorioamnionitis- Unasyn ordered by delivery provider to complete at 24 hrs No void: if not able to void by 6 hrs after straight cath, will replace foley overnight. Mild acute blood loss anemia with Hb 9.9.  Cont. PNV with Fe. Other routine care.    Allyn Kenner

## 2022-07-08 NOTE — Progress Notes (Signed)
RN encouraged pt to attempt to void again pt states she hasn't tried in a few hours. RN placed hat in toilet.

## 2022-07-08 NOTE — Progress Notes (Signed)
RN contacted Dr. Rogue Bussing about pt not being able to void.  Rogue Bussing ordered that pt may had I/O with 6 hr voided trial.  If still unable to void this afternoon pt will need foley catheter x24 hr.

## 2022-07-09 NOTE — Progress Notes (Signed)
Patient ID: Jamie Simmons, female   DOB: 1990-07-20, 32 y.o.   MRN: [id number]  Telephone call from RN.  Patient had Foley catheter removed earlier today and has been unable to void.  Bladder scan was performed which shows a volume of 233 cc.  Patient is reluctant to have Foley catheter replaced.  Would like to try in and out cath first.  If still unable to void after 6 hours after in and out cath will replace Foley catheter.

## 2022-07-09 NOTE — Progress Notes (Signed)
MOB was referred for history of depression/anxiety. * Referral screened out by Clinical Social Worker because none of the following criteria appear to apply: ~ History of anxiety/depression during this pregnancy, or of post-partum depression following prior delivery. ~ Diagnosis of anxiety and/or depression within last 3 years OR * MOB's symptoms currently being treated with medication and/or therapy. MOB has an active Rx for Celexa.   Please contact the Clinical Social Worker if needs arise, by [facility] request, or if MOB scores greater than 9/yes to question 10 on Edinburgh Postpartum Depression Screen.   Laurey Arrow, MSW, LCSW Clinical Social Work [phone]

## 2022-07-09 NOTE — Progress Notes (Signed)
2220. Patient stated she just voided few drops & does not want in & out catheter just yet. Sated " I just need more time, I can do it, I know I can do it". Will recheck per request.

## 2022-07-09 NOTE — Progress Notes (Addendum)
A920 Patient informed about plan of care for tonight. She stated she has drank minimal fluids today. Informed that MD ordered to do in & out cath x1 then wait 6 hrs, if no void will place foley catheter back in. Patient requested to wait after she drinks fluids for an hour or til 2100 2115. Patient drinking fluids @ this time. No void yet but stated she can feel that she can void soon. Requesting to reevaluate @ 2215. No complaints of bladder discomfort. Will continue to monitor.

## 2022-07-09 NOTE — Progress Notes (Signed)
Spoke to Dr Harrington Challenger about pt continued urinary retention after removing the indwelling catheter at 1115. Per previous order Dr Harrington Challenger recommended to replace indwelling catheter if patient was unable to void, however pt requested to try in and out catheterization first.  Bladder scan showed 233 ml volume in the bladder.  Informed Dr Harrington Challenger about case and patients request.  Ordered in and out cath first, and 6 hrs after that catheterization if pt is unable to void, replace indwelling catheter.

## 2022-07-09 NOTE — Progress Notes (Signed)
Post Partum Day 2 Subjective: Difficulty with urinating last night therefore foley replaced. C/o a lot of pressure and discomfort especially with hemorrhoid. Bleeding appropriate, no nausea or vomiting.   Objective: Blood pressure 111/71, pulse 82, temperature 97.8 F (36.6 C), temperature source Oral, resp. rate 16, SpO2 98 %, unknown if currently breastfeeding.  Physical Exam:  General: alert, cooperative, and appears stated age 32: appropriate Uterine Fundus: firm Incision: healing well DVT Evaluation: No evidence of DVT seen on physical exam.  Recent Labs    07/06/22 1735 07/08/22 0811  HGB 13.7 9.9*  HCT 41.7 30.4*    Assessment/Plan: Plan removing of foley with voiding trial if unable to void after 6 hrs and full bladder will replace foley. Consider d/c home with leg bag Encouraged witch hazel and dibucaine ointment for hemorrhoids, discussed sitz baths. Encouraged ambulation   LOS: 3 days   Vanessa Kick, MD 07/09/2022, 10:28 AM

## 2022-07-10 MED ORDER — IBUPROFEN 600 MG PO TABS: 600.0000 mg | ORAL_TABLET | Freq: Four times a day (QID) | ORAL | 0 refills | Status: DC

## 2022-07-10 NOTE — Progress Notes (Signed)
Mother encouraged to void. Mother reported she had tried to void at 0600, but could not. Mother requested more time to try to void. Mother declined indwelling catheterization.   Mother bladder scanned and found to be 45ml in bladder.  Mother voiced understanding of plan of care and denied further needs.   Leandra Kern RN

## 2022-07-10 NOTE — Progress Notes (Signed)
Post Partum Day 3 Subjective: tolerating PO  Pt reports ambulating.  Had difficulty voiding since delivery with intermittent straight cath.  853ml evacuated last night around midnight--refused indwelling foley but has not yet voided.  Pain controlled with meds  Bottlefeeding.  Wants to go home   Objective: Blood pressure 115/75, pulse 81, temperature 98.2 F (36.8 C), temperature source Oral, resp. rate 18, SpO2 100 %, unknown if currently breastfeeding.  Physical Exam:  General: alert and cooperative Lochia: appropriate Uterine Fundus: firm   Recent Labs    07/08/22 0811  HGB 9.9*  HCT 30.4*    Assessment/Plan: Long d/w pt and husband and explained that at this point we need to place a foley and rest the bladder for a few days to give time to recover.  Advised that allowing it to become overdistended will only slow recovery.  Pt agreeable.  Will place foley and send home with leg bag.  Plan to have her come to office Monday or Tuesday for removal and voiding trial.     LOS: 4 days   Logan Bores, MD 07/10/2022, 8:05 AM

## 2022-07-10 NOTE — Discharge Summary (Signed)
Postpartum Discharge Summary       Patient Name: Jamie Simmons DOB: September 21, 1990 MRN: [id number]  Date of admission: 07/06/2022 Delivery date:07/07/2022  Delivering provider: Deliah Boston  Date of discharge: 07/10/2022  Admitting diagnosis: Uterine contractions [O47.9] Intrauterine pregnancy: [redacted]w[redacted]d     Secondary diagnosis:  Principal Problem:   Uterine contractions  Additional problems: Urinary retention postpartum    Discharge diagnosis: Term Pregnancy Delivered                                              Post partum procedures: none Augmentation: AROM and Pitocin Complications: None  Hospital course: Onset of Labor With Vaginal Delivery      32 y.o. yo G1P1001 at [redacted]w[redacted]d was admitted in Latent Labor on 07/06/2022. Labor course was uncomplicated.  Membrane Rupture Time/Date: 2:10 AM ,07/07/2022   Delivery Method:Vaginal, Spontaneous  Episiotomy: None  Lacerations:  Sulcus  Patient had a postpartum course complicated by urinary retention that did not resolve after several days of intermittent catheterization.  After refusing for several days she agreed to be d/c home with a foley catheter in place and give the bladder a few days of rest.  She otherwise is ambulating, tolerating a regular diet, passing flatus .Patient is discharged home in stable condition on 07/10/22.  Newborn Data: Birth date:07/07/2022  Birth time:3:28 PM  Gender:Female  Living status:Living  Apgars:6 ,9  Weight:3230 g     Physical exam  Vitals:   07/09/22 1134 07/09/22 1356 07/09/22 2150 07/10/22 0500  BP: 120/84 (!) 92/59 (!) 130/90 115/75  Pulse: 87 93 (!) 117 81  Resp: 17 16 17 18   Temp: 98.3 F (36.8 C) 98.2 F (36.8 C) 98.2 F (36.8 C) 98.2 F (36.8 C)  TempSrc: Oral Oral Oral Oral  SpO2: 99% 99% 99% 100%   General: alert and cooperative Lochia: appropriate Uterine Fundus: firm  Labs: Lab Results  Component Value Date   WBC 16.4 (H) 07/08/2022   HGB 9.9 (L) 07/08/2022   HCT  30.4 (L) 07/08/2022   MCV 90.7 07/08/2022   PLT 224 07/08/2022      Latest Ref Rng & Units 06/08/2017   12:09 PM  CMP  Glucose 65 - 99 mg/dL 105   BUN 6 - 20 mg/dL 10   Creatinine 0.57 - 1.00 mg/dL 0.85   Sodium 134 - 144 mmol/L 142   Potassium 3.5 - 5.2 mmol/L 4.3   Chloride 96 - 106 mmol/L 108   CO2 20 - 29 mmol/L 17   Calcium 8.7 - 10.2 mg/dL 9.8   Total Protein 6.0 - 8.5 g/dL 7.2   Total Bilirubin 0.0 - 1.2 mg/dL 0.3   Alkaline Phos 39 - 117 IU/L 82   AST 0 - 40 IU/L 28   ALT 0 - 32 IU/L 30    Edinburgh Score:    07/10/2022    5:00 AM  Edinburgh Postnatal Depression Scale Screening Tool  I have been able to laugh and see the funny side of things. 0  I have looked forward with enjoyment to things. 0  I have blamed myself unnecessarily when things went wrong. 0  I have been anxious or worried for no good reason. 0  I have felt scared or panicky for no good reason. 0  Things have been getting on top of me. 0  I  have been so unhappy that I have had difficulty sleeping. 0  I have felt sad or miserable. 0  I have been so unhappy that I have been crying. 0  The thought of harming myself has occurred to me. 0  Edinburgh Postnatal Depression Scale Total 0     After visit meds:  Allergies as of 07/10/2022   No Known Allergies      Medication List     TAKE these medications    acetaminophen 500 MG tablet Commonly known as: TYLENOL Take 1,000 mg by mouth every 6 (six) hours as needed for moderate pain.   citalopram 20 MG tablet Commonly known as: CELEXA Take 20 mg by mouth daily.   ibuprofen 600 MG tablet Commonly known as: ADVIL Take 1 tablet (600 mg total) by mouth every 6 (six) hours.   omeprazole 20 MG capsule Commonly known as: PRILOSEC Take 20 mg by mouth daily.         Discharge home in stable condition Infant Feeding: Bottle Infant Disposition:home with mother Discharge instruction: per After Visit Summary and Postpartum booklet. Activity:  Advance as tolerated. Pelvic rest for 6 weeks.  Diet: routine diet Future Appointments:No future appointments. Follow up Visit:  Follow-up Information     Ob/Gyn, [facility]. Schedule an appointment as soon as possible for a visit.   Why: Office will call to come in Monday or Tuesday for catheter removal and voiding trial Contact information: [place] [address] [phone]                  Please schedule this patient for a In person postpartum visit in 2-3 days with the following provider: MD.  Delivery mode:  Vaginal, Spontaneous  Anticipated Birth Control:  Unsure   07/10/2022 Logan Bores, MD

## 2022-07-10 NOTE — Lactation Note (Signed)
This note was copied from a baby's chart. Lactation Consultation Note  Patient Name: Jamie Simmons [id number] Date: 07/10/2022   Age:31 hours  LC attempted to visit with the birth parent, but the NP was in the room.    Maternal Data    Feeding Nipple Type: Nfant Slow Flow (purple)  LATCH Score                    Lactation Tools Discussed/Used    Interventions    Discharge    Consult Status      [name] 07/10/2022, 1:10 PM

## 2022-07-10 NOTE — Progress Notes (Signed)
This RN received report on patient Jamie Simmons at 2337 07/09/2022. Per report patient was bladder scanned at 1900 and volume was found to be 254ml. Per report, patient declined straight cath at this time and requested that she be straight cathed at her request.   At 0005 07/10/2022, patient called out to nurse's station and requested to be straight cathed. NT Ciera C. Straight cathed patient and NT Rosey Bath., and RN Leandra Kern. Assisted.  864ml of Urine was drained with straight cath. Swelling noted around patient's urethra and perineum. Patient educated to utilize ice packs and dibucaine ointment to reduce swelling. Patient voiced understanding of teaching.   Patient reported that she wishes to be given until 0600 07/10/2022 to void.   Dr. Harrington Challenger notified of patient's straight cath and 82ml urine 0023 07/10/2022. This RN received orders from Dr. Harrington Challenger to insert an indwelling catheter into the patient at this time.  This RN discussed plan of care with patient and patient refused indwelling catheter at this time. Patient requested to have until 0600 07/10/2022 to void.   This RN called Dr. Harrington Challenger at [phone] to confirm orders for an indwelling catheter to be placed. Dr. Harrington Challenger confirmed for the indwelling catheter to be placed at this time. This RN reported to Dr. Harrington Challenger that patient refused to have indwelling catheter inserted at this time and that patient requested to have until 0600 to void. Dr. Harrington Challenger acknowledged patient's refusal. This RN received no new orders from Dr. Harrington Challenger at this time.   This RN discussed plan of care with patient at [phone] 07/10/2022 and patient voiced understanding of Dr. Harrington Challenger' order for indwelling catheter to be placed. Patient refused indwelling catheter placement at this time. Patient voiced understanding of plan of care and denied further needs.   Leandra Kern. RN

## 2022-07-14 ENCOUNTER — Inpatient Hospital Stay (HOSPITAL_COMMUNITY)
Admission: RE | Admit: 2022-07-14 | Payer: BC Managed Care – PPO | Source: Home / Self Care | Admitting: Obstetrics and Gynecology

## 2022-07-17 ENCOUNTER — Telehealth (HOSPITAL_COMMUNITY): Payer: Self-pay

## 2022-07-17 NOTE — Telephone Encounter (Signed)
Patient did not answer phone call. Voicemail left for patient.   Sharyn [facility] 07/17/22,1416

## 2022-07-20 ENCOUNTER — Inpatient Hospital Stay (EMERGENCY_DEPARTMENT_HOSPITAL)
Admission: AD | Admit: 2022-07-20 | Discharge: 2022-07-20 | Disposition: A | Discharge status: Home / Self Care | Payer: BC Managed Care – PPO | Source: Home / Self Care | Attending: Obstetrics and Gynecology | Admitting: Obstetrics and Gynecology

## 2022-07-20 DIAGNOSIS — G44209 Tension-type headache, unspecified, not intractable: Secondary | ICD-10-CM

## 2022-07-20 DIAGNOSIS — Z1152 Encounter for screening for COVID-19: Secondary | ICD-10-CM | POA: Insufficient documentation

## 2022-07-20 DIAGNOSIS — R0682 Tachypnea, not elsewhere classified: Secondary | ICD-10-CM | POA: Diagnosis not present

## 2022-07-20 DIAGNOSIS — O8604 Sepsis following an obstetrical procedure: Secondary | ICD-10-CM | POA: Diagnosis not present

## 2022-07-20 DIAGNOSIS — A419 Sepsis, unspecified organism: Secondary | ICD-10-CM | POA: Diagnosis not present

## 2022-07-20 DIAGNOSIS — O99893 Other specified diseases and conditions complicating puerperium: Secondary | ICD-10-CM | POA: Insufficient documentation

## 2022-07-20 LAB — CBC WITH DIFFERENTIAL/PLATELET
Abs Immature Granulocytes: 1.32 10*3/uL — ABNORMAL HIGH (ref 0.00–0.07)
Basophils Absolute: 0.1 10*3/uL (ref 0.0–0.1)
Basophils Relative: 0 %
Eosinophils Absolute: 0.1 10*3/uL (ref 0.0–0.5)
Eosinophils Relative: 0 %
HCT: 29.9 % — ABNORMAL LOW (ref 36.0–46.0)
Hemoglobin: 10.1 g/dL — ABNORMAL LOW (ref 12.0–15.0)
Immature Granulocytes: 7 %
Lymphocytes Relative: 9 %
Lymphs Abs: 1.7 10*3/uL (ref 0.7–4.0)
MCH: 28.9 pg (ref 26.0–34.0)
MCHC: 33.8 g/dL (ref 30.0–36.0)
MCV: 85.7 fL (ref 80.0–100.0)
Monocytes Absolute: 1.2 10*3/uL — ABNORMAL HIGH (ref 0.1–1.0)
Monocytes Relative: 7 %
Neutro Abs: 13.9 10*3/uL — ABNORMAL HIGH (ref 1.7–7.7)
Neutrophils Relative %: 77 %
Platelets: 93 10*3/uL — ABNORMAL LOW (ref 150–400)
RBC: 3.49 MIL/uL — ABNORMAL LOW (ref 3.87–5.11)
RDW: 14.5 % (ref 11.5–15.5)
WBC: 18.2 10*3/uL — ABNORMAL HIGH (ref 4.0–10.5)
nRBC: 0 % (ref 0.0–0.2)

## 2022-07-20 LAB — RESP PANEL BY RT-PCR (RSV, FLU A&B, COVID)  RVPGX2
Influenza A by PCR: NEGATIVE
Influenza B by PCR: NEGATIVE
Resp Syncytial Virus by PCR: NEGATIVE
SARS Coronavirus 2 by RT PCR: NEGATIVE

## 2022-07-20 MED ORDER — DEXAMETHASONE SODIUM PHOSPHATE 10 MG/ML IJ SOLN
10.0000 mg | Freq: Once | INTRAMUSCULAR | Status: AC
  Administered 2022-07-20: 10 mg via INTRAVENOUS
  Filled 2022-07-20: qty 1

## 2022-07-20 MED ORDER — DIPHENHYDRAMINE HCL 50 MG/ML IJ SOLN
25.0000 mg | Freq: Once | INTRAMUSCULAR | Status: AC
  Administered 2022-07-20: 25 mg via INTRAVENOUS
  Filled 2022-07-20: qty 1

## 2022-07-20 MED ORDER — METOCLOPRAMIDE HCL 5 MG/ML IJ SOLN
10.0000 mg | Freq: Once | INTRAMUSCULAR | Status: AC
  Administered 2022-07-20: 10 mg via INTRAVENOUS
  Filled 2022-07-20: qty 2

## 2022-07-20 MED ORDER — LACTATED RINGERS IV BOLUS
1000.0000 mL | Freq: Once | INTRAVENOUS | Status: AC
  Administered 2022-07-20: 1000 mL via INTRAVENOUS

## 2022-07-20 NOTE — MAU Provider Note (Signed)
History     CSN: [id number]  Arrival date and time: 07/20/22 1409   Event Date/Time   First Provider Initiated Contact with Patient 07/20/22 1452      Chief Complaint  Patient presents with   Headache   Chills   Generalized Body Aches   HPI  Jamie Simmons is a 32 y.o. G1P1001 postpartum from a vaginal delivery on 1/31 who presents for evaluation of a headache. Patient reports she has a history of migraines but this feels different. It started on Saturday and has been consistent since then. Patient rates the pain as a 11/10 and has tried Tylenol and Advil for the pain with no relief. She states she has not been sleeping much and has only eaten toast this morning and nothing since. She is breast and bottle feeding but only breast feeding a few times a day. She also reports intermittent chills.    OB History     Gravida  1   Para  1   Term  1   Preterm      AB      Living  1      SAB      IAB      Ectopic      Multiple  0   Live Births  1           Past Medical History:  Diagnosis Date   Attention deficit disorder    Migraine    Nephrolithiasis 02/06/2012    Past Surgical History:  Procedure Laterality Date   WISDOM TOOTH EXTRACTION      Family History  Problem Relation Age of Onset   Drug abuse Mother    Migraines Mother    Hypertension Mother    Diabetes Father    Obesity Father     Social History   Tobacco Use   Smoking status: Never   Smokeless tobacco: Never  Vaping Use   Vaping Use: Never used  Substance Use Topics   Alcohol use: No   Drug use: No    Allergies: No Known Allergies  No medications prior to admission.    Review of Systems  Constitutional: Negative.  Negative for fatigue and fever.  HENT: Negative.    Respiratory: Negative.  Negative for shortness of breath.   Cardiovascular: Negative.  Negative for chest pain.  Gastrointestinal: Negative.  Negative for abdominal pain, constipation, diarrhea, nausea and  vomiting.  Genitourinary: Negative.  Negative for dysuria, vaginal bleeding and vaginal discharge.  Neurological:  Positive for headaches. Negative for dizziness.   Physical Exam   Blood pressure 124/78, pulse (!) 121, temperature 99.4 F (37.4 C), temperature source Oral, resp. rate 20, height 5' 5"$  (1.651 m), weight 97.8 kg, SpO2 99 %, currently breastfeeding.  Patient Vitals for the past 24 hrs:  BP Temp Temp src Pulse Resp SpO2 Height Weight  07/20/22 1708 124/78 -- -- (!) 121 -- -- -- --  07/20/22 1704 -- -- -- (!) 119 -- 99 % -- --  07/20/22 1703 -- -- -- (!) 120 -- 99 % -- --  07/20/22 1656 -- -- -- (!) 125 -- -- -- --  07/20/22 1510 -- -- -- -- -- 100 % -- --  07/20/22 1438 117/77 99.4 F (37.4 C) Oral (!) 132 20 100 % 5' 5"$  (1.651 m) 97.8 kg    Physical Exam Vitals and nursing note reviewed.  Constitutional:      General: She is not in acute distress.  Appearance: She is well-developed.  HENT:     Head: Normocephalic.  Eyes:     Pupils: Pupils are equal, round, and reactive to light.  Cardiovascular:     Rate and Rhythm: Normal rate and regular rhythm.     Heart sounds: Normal heart sounds.  Pulmonary:     Effort: Pulmonary effort is normal. No respiratory distress.     Breath sounds: Normal breath sounds.  Abdominal:     General: Bowel sounds are normal. There is no distension.     Palpations: Abdomen is soft.     Tenderness: There is no abdominal tenderness.  Skin:    General: Skin is warm and dry.  Neurological:     Mental Status: She is alert and oriented to person, place, and time.  Psychiatric:        Mood and Affect: Mood is anxious.        Behavior: Behavior normal.        Thought Content: Thought content normal.        Judgment: Judgment normal.       MAU Course  Procedures  Results for orders placed or performed during the hospital encounter of 07/20/22 (from the past 24 hour(s))  Resp panel by RT-PCR (RSV, Flu A&B, Covid) Anterior Nasal  Swab     Status: None   Collection Time: 07/20/22  2:41 PM   Specimen: Anterior Nasal Swab  Result Value Ref Range   SARS Coronavirus 2 by RT PCR NEGATIVE NEGATIVE   Influenza A by PCR NEGATIVE NEGATIVE   Influenza B by PCR NEGATIVE NEGATIVE   Resp Syncytial Virus by PCR NEGATIVE NEGATIVE  CBC with Differential/Platelet     Status: Abnormal   Collection Time: 07/20/22  3:01 PM  Result Value Ref Range   WBC 18.2 (H) 4.0 - 10.5 K/uL   RBC 3.49 (L) 3.87 - 5.11 MIL/uL   Hemoglobin 10.1 (L) 12.0 - 15.0 g/dL   HCT 29.9 (L) 36.0 - 46.0 %   MCV 85.7 80.0 - 100.0 fL   MCH 28.9 26.0 - 34.0 pg   MCHC 33.8 30.0 - 36.0 g/dL   RDW 14.5 11.5 - 15.5 %   Platelets 93 (L) 150 - 400 K/uL   nRBC 0.0 0.0 - 0.2 %   Neutrophils Relative % 77 %   Neutro Abs 13.9 (H) 1.7 - 7.7 K/uL   Lymphocytes Relative 9 %   Lymphs Abs 1.7 0.7 - 4.0 K/uL   Monocytes Relative 7 %   Monocytes Absolute 1.2 (H) 0.1 - 1.0 K/uL   Eosinophils Relative 0 %   Eosinophils Absolute 0.1 0.0 - 0.5 K/uL   Basophils Relative 0 %   Basophils Absolute 0.1 0.0 - 0.1 K/uL   Immature Granulocytes 7 %   Abs Immature Granulocytes 1.32 (H) 0.00 - 0.07 K/uL    MDM Labs ordered and reviewed.   UA CBC with Diff LR bolus HA cocktail  Patient reports complete resolution of HA while in MAU. CNM emphasized importance of proper nutrition/hydration and rest during the postpartum period.  Assessment and Plan   1. Acute non intractable tension-type headache   2. Postpartum state     -Discharge home in stable condition -Postpartum precautions discussed -Patient advised to follow-up with OB as scheduled for postpartum care -Patient may return to MAU as needed or if her condition were to change or worsen  Wende Mott, CNM 07/20/2022, 2:52 PM

## 2022-07-20 NOTE — MAU Note (Addendum)
...  Jamie Simmons is a 32 y.o. at Unknown here in MAU reporting: HA, body aches, chills, and intermittent sharp upper abdominal pain. She reports this all begun around this past Saturday. She reports she has been taking Tylenol and Ibuprofen but that it has not eased her pain. Denies cough, congestion, and SOB. Endorses a hx of migraines but reports this is the worst HA she has ever had. Left sided. Denies fever. Last BM today. When asked about her vaginal bleeding she reports she has passed 1-2 blood clots each day that are the size of a golf ball. She reports her bleeding has remained consistent but she is not soaking through pads. Denies foul smelling vaginal odors.Denies breast tenderness and redness of her breasts.   Has not slept much since Saturday. Has only eaten one piece of toast today. She reports she has not had an appetite with this pain. Has not been eating much since Saturday either.  Tachycardic in triage. 130's.  Last medications: Tylenol - last dose 1330 - 1000 mg Advil - 0800 this morning - 600 mg  Onset of complaint: Saturday  Pain score:  9/10 body aches 10/10 HA  Lab orders placed from triage: Resp Panel

## 2022-07-22 ENCOUNTER — Inpatient Hospital Stay (HOSPITAL_COMMUNITY)
Admission: AD | Admit: 2022-07-22 | Discharge: 2022-07-26 | DRG: 776 | Disposition: A | Discharge status: Home / Self Care | Payer: BC Managed Care – PPO | Attending: Obstetrics and Gynecology | Admitting: Obstetrics and Gynecology

## 2022-07-22 DIAGNOSIS — B9620 Unspecified Escherichia coli [E. coli] as the cause of diseases classified elsewhere: Secondary | ICD-10-CM | POA: Diagnosis present

## 2022-07-22 DIAGNOSIS — N179 Acute kidney failure, unspecified: Secondary | ICD-10-CM | POA: Diagnosis present

## 2022-07-22 DIAGNOSIS — O8604 Sepsis following an obstetrical procedure: Principal | ICD-10-CM | POA: Diagnosis present

## 2022-07-22 DIAGNOSIS — A419 Sepsis, unspecified organism: Principal | ICD-10-CM

## 2022-07-22 DIAGNOSIS — O9049 Other postpartum acute kidney failure: Secondary | ICD-10-CM | POA: Diagnosis present

## 2022-07-22 DIAGNOSIS — Z20822 Contact with and (suspected) exposure to covid-19: Secondary | ICD-10-CM | POA: Diagnosis present

## 2022-07-22 LAB — URINALYSIS, ROUTINE W REFLEX MICROSCOPIC
Bilirubin Urine: NEGATIVE
Glucose, UA: NEGATIVE mg/dL
Ketones, ur: NEGATIVE mg/dL
Nitrite: NEGATIVE
Protein, ur: 100 mg/dL — AB
Specific Gravity, Urine: 1.003 — ABNORMAL LOW (ref 1.005–1.030)
WBC, UA: >50 WBC/hpf (ref 0–5)
pH: 7 (ref 5.0–8.0)

## 2022-07-22 MED ORDER — SODIUM CHLORIDE 0.9 % IV SOLN: INTRAVENOUS | Status: DC

## 2022-07-22 MED ORDER — LACTATED RINGERS IV BOLUS
1000.0000 mL | Freq: Once | INTRAVENOUS | Status: AC
  Administered 2022-07-22: 1000 mL via INTRAVENOUS

## 2022-07-22 MED ORDER — KETOROLAC TROMETHAMINE 30 MG/ML IJ SOLN
30.0000 mg | Freq: Once | INTRAMUSCULAR | Status: AC
  Administered 2022-07-22: 30 mg via INTRAVENOUS
  Filled 2022-07-22: qty 1

## 2022-07-22 MED ORDER — SODIUM CHLORIDE 0.9 % IV SOLN
2.0000 g | INTRAVENOUS | Status: DC
  Administered 2022-07-22: 2 g via INTRAVENOUS
  Filled 2022-07-22: qty 20

## 2022-07-22 NOTE — MAU Provider Note (Signed)
History     CSN: [id number]  Arrival date and time: 07/22/22 2208   Event Date/Time   First Provider Initiated Contact with Patient 07/22/22 2352      Chief Complaint  Patient presents with   Fever   Headache   Flank Pain   Jamie Simmons is a 32 y.o. G1P1001 at 16 days Postpartum from a SVD who receives care at [facility].  She presents today for flu-like symptoms. She states she was seen 2 days ago with similar symptoms and that they have worsened since that time. She reports back pain and difficulty with urination. She states she was discharged with a foley catheter in place (2/3) and it was removed Monday (2/12). She states since then she has had difficulty with urination and feels sh eis not emptying her bladder completely.  She states she has a HA that she rates a 11/10 despite tylenol and ibuprofen dosing.  However, she denies dosing today.  She also reports that she is breastfeeding, but hasn't pumped or put baby to the breast "in a couple of days" because she didn't feel good. She states she took her temperature at home and it was 97 degrees.  Patient reports bleeding is okay now, but was heavy yesterday with passing of one golf ball sized clot.  She denies abdominal pain or discomfort.     OB History     Gravida  1   Para  1   Term  1   Preterm      AB      Living  1      SAB      IAB      Ectopic      Multiple  0   Live Births  1           Past Medical History:  Diagnosis Date   Attention deficit disorder    Migraine    Nephrolithiasis 02/06/2012    Past Surgical History:  Procedure Laterality Date   WISDOM TOOTH EXTRACTION      Family History  Problem Relation Age of Onset   Drug abuse Mother    Migraines Mother    Hypertension Mother    Diabetes Father    Obesity Father     Social History   Tobacco Use   Smoking status: Never   Smokeless tobacco: Never  Vaping Use   Vaping Use: Never used  Substance Use Topics    Alcohol use: No   Drug use: No    Allergies: No Known Allergies  Medications Prior to Admission  Medication Sig Dispense Refill Last Dose   acetaminophen (TYLENOL) 500 MG tablet Take 1,000 mg by mouth every 6 (six) hours as needed for moderate pain.      citalopram (CELEXA) 20 MG tablet Take 20 mg by mouth daily.      ibuprofen (ADVIL) 600 MG tablet Take 1 tablet (600 mg total) by mouth every 6 (six) hours. 30 tablet 0    omeprazole (PRILOSEC) 20 MG capsule Take 20 mg by mouth daily. (Patient not taking: Reported on 07/22/2022)   Not Taking    Review of Systems  Constitutional:  Positive for chills. Negative for fever.  Eyes:  Negative for visual disturbance.  Respiratory:  Positive for shortness of breath and wheezing.   Genitourinary:  Positive for difficulty urinating and vaginal bleeding. Negative for dysuria and vaginal discharge.   Physical Exam   Blood pressure 125/88, pulse (!) 127, temperature (!) 101.2  F (38.4 C), resp. rate (!) 40, height 5' 5"$  (1.651 m), weight 99.3 kg, SpO2 100 %, currently breastfeeding.  Vitals:   07/22/22 2350 07/23/22 0011  BP:    Pulse: (!) 127 (!) 113  Resp: (!) 40 (!) 36  Temp:    SpO2:  96%     Physical Exam Vitals reviewed.  Constitutional:      Appearance: She is well-developed. She is ill-appearing and diaphoretic. She is not toxic-appearing.  HENT:     Head: Normocephalic and atraumatic.  Eyes:     Conjunctiva/sclera: Conjunctivae normal.  Cardiovascular:     Rate and Rhythm: Tachycardia present.  Pulmonary:     Breath sounds: Wheezing present.  Musculoskeletal:        General: Normal range of motion.     Cervical back: Normal range of motion.     Right lower leg: No edema.     Left lower leg: No edema.  Skin:    General: Skin is warm and moist.     Coloration: Skin is pale.  Neurological:     Mental Status: She is alert and oriented to person, place, and time.  Psychiatric:        Mood and Affect: Mood normal.      MAU Course  Procedures Results for orders placed or performed during the hospital encounter of 07/22/22 (from the past 24 hour(s))  Urinalysis, Routine w reflex microscopic -Urine, Clean Catch     Status: Abnormal   Collection Time: 07/22/22 10:40 PM  Result Value Ref Range   Color, Urine YELLOW YELLOW   APPearance HAZY (A) CLEAR   Specific Gravity, Urine 1.003 (L) 1.005 - 1.030   pH 7.0 5.0 - 8.0   Glucose, UA NEGATIVE NEGATIVE mg/dL   Hgb urine dipstick LARGE (A) NEGATIVE   Bilirubin Urine NEGATIVE NEGATIVE   Ketones, ur NEGATIVE NEGATIVE mg/dL   Protein, ur 100 (A) NEGATIVE mg/dL   Nitrite NEGATIVE NEGATIVE   Leukocytes,Ua LARGE (A) NEGATIVE   RBC / HPF 0-5 0 - 5 RBC/hpf   WBC, UA >50 0 - 5 WBC/hpf   Bacteria, UA RARE (A) NONE SEEN   Squamous Epithelial / HPF 0-5 0 - 5 /HPF   WBC Clumps PRESENT    Mucus PRESENT   CBC with Differential/Platelet     Status: Abnormal   Collection Time: 07/22/22 11:15 PM  Result Value Ref Range   WBC 16.8 (H) 4.0 - 10.5 K/uL   RBC 3.13 (L) 3.87 - 5.11 MIL/uL   Hemoglobin 8.9 (L) 12.0 - 15.0 g/dL   HCT 27.4 (L) 36.0 - 46.0 %   MCV 87.5 80.0 - 100.0 fL   MCH 28.4 26.0 - 34.0 pg   MCHC 32.5 30.0 - 36.0 g/dL   RDW 15.2 11.5 - 15.5 %   Platelets 89 (L) 150 - 400 K/uL   nRBC 0.0 0.0 - 0.2 %   Neutrophils Relative % 74 %   Neutro Abs 12.3 (H) 1.7 - 7.7 K/uL   Lymphocytes Relative 13 %   Lymphs Abs 2.2 0.7 - 4.0 K/uL   Monocytes Relative 11 %   Monocytes Absolute 1.9 (H) 0.1 - 1.0 K/uL   Eosinophils Relative 0 %   Eosinophils Absolute 0.0 0.0 - 0.5 K/uL   Basophils Relative 0 %   Basophils Absolute 0.1 0.0 - 0.1 K/uL   Immature Granulocytes 2 %   Abs Immature Granulocytes 0.38 (H) 0.00 - 0.07 K/uL  Protime-INR     Status:  None   Collection Time: 07/22/22 11:15 PM  Result Value Ref Range   Prothrombin Time 14.2 11.4 - 15.2 seconds   INR 1.1 0.8 - 1.2  APTT     Status: None   Collection Time: 07/22/22 11:15 PM  Result Value Ref  Range   aPTT 29 24 - 36 seconds   DG CHEST PORT 1 VIEW  Result Date: 07/23/2022 CLINICAL DATA:  Status post delivery 2 days ago, presenting with tachypnea and wheezing. EXAM: PORTABLE CHEST 1 VIEW COMPARISON:  August 19, 2027 FINDINGS: The heart size and mediastinal contours are within normal limits. Low lung volumes are noted with mild right infrahilar and left lateral basilar atelectasis. There is no evidence of a pleural effusion or pneumothorax. The visualized skeletal structures are unremarkable. IMPRESSION: Low lung volumes with mild right infrahilar and left lateral basilar atelectasis. Electronically Signed   By: Virgina Norfolk M.D.   On: 07/23/2022 00:36    MDM Sepsis Protocol Initiated Labs: CBC/D, CMP, INR, APTT, Lactic Acid, UA, UC, Blood Culture   Start IV with LR Bolus Rocephin with NS infusion Pain Medication Chest X Ray Respiratory Panel Assessment and Plan  32 year old 66 Days Postpartum Flu-Like Symptoms  -Vitals reviewed and suspicious for sepsis. -Start IV with LR Bolus. -Labs: CBC/D, CMP,  -Dr. Jeani Sow. Anyanwu on unit upon patient arrival. Informed of patient status. Advises: *Proceed with sepsis protocol.  *Toradol for pain. *Rocephin for antibiotic treatment.  -Chest x ray for tachypnea/wheezing. -Repeat respiratory panel.   Maryann Conners 07/22/2022, 11:52 PM   Reassessment (12:46 AM) -Lactic Acid returns at 2.7 -Dr. Mickle Mallory contacted and no answer.  -Will attempt to contact again. -Patient reports improvement in pain with Toradol dosing. Now 4/10.  Reassessment (1:02 AM) -Able to contact Dr. Mickle Mallory. Report given. -Care relinquished.  Maryann Conners MSN, CNM Advanced Practice Provider, [facility]

## 2022-07-22 NOTE — MAU Provider Note (Incomplete)
  History     CSN: [id number]  Arrival date and time: 07/22/22 2208   Event Date/Time   First Provider Initiated Contact with Patient 07/22/22 2352      Chief Complaint  Patient presents with  . Fever  . Headache  . Flank Pain    {GYN/OB [id number]}  Past Medical History:  Diagnosis Date  . Attention deficit disorder   . Migraine   . Nephrolithiasis 02/06/2012    Past Surgical History:  Procedure Laterality Date  . WISDOM TOOTH EXTRACTION      Family History  Problem Relation Age of Onset  . Drug abuse Mother   . Migraines Mother   . Hypertension Mother   . Diabetes Father   . Obesity Father     Social History   Tobacco Use  . Smoking status: Never  . Smokeless tobacco: Never  Vaping Use  . Vaping Use: Never used  Substance Use Topics  . Alcohol use: No  . Drug use: No    Allergies: No Known Allergies  Medications Prior to Admission  Medication Sig Dispense Refill Last Dose  . acetaminophen (TYLENOL) 500 MG tablet Take 1,000 mg by mouth every 6 (six) hours as needed for moderate pain.     . citalopram (CELEXA) 20 MG tablet Take 20 mg by mouth daily.     Marland Kitchen ibuprofen (ADVIL) 600 MG tablet Take 1 tablet (600 mg total) by mouth every 6 (six) hours. 30 tablet 0   . omeprazole (PRILOSEC) 20 MG capsule Take 20 mg by mouth daily. (Patient not taking: Reported on 07/22/2022)   Not Taking    Review of Systems Physical Exam   Blood pressure 125/88, pulse (!) 127, temperature (!) 101.2 F (38.4 C), resp. rate (!) 40, height 5\' 5"  (1.651 m), weight 99.3 kg, SpO2 100 %, currently breastfeeding.  Physical Exam  MAU Course  Procedures  MDM ***  Assessment and Plan  ***  Maryann Conners 07/22/2022, 11:52 PM

## 2022-07-22 NOTE — MAU Note (Signed)
Pt up to BR. Rocephin infusing. 300cc LR infused before flds changed to NS and Rocephin started

## 2022-07-22 NOTE — MAU Note (Addendum)
.  Jamie Simmons is a 32 y.o. at Unknown here in MAU reporting headache, flank pain bilaterally, hurting all over for 6 days. Feels like the worst flu ever but her test was negative 2 days ago. Tylenol and advil not helping. Pt teary eyed in Triage stating she feels so bad and has never hurt so badly before  Onset of complaint: 6days Pain score: 10 Vitals:   07/22/22 2235  BP: 125/88  Pulse: (!) 148  Resp: (!) 28  Temp: (!) 101.2 F (38.4 C)  SpO2: 100%     FHT:n/a Lab orders placed from triage:  u/a

## 2022-07-23 ENCOUNTER — Other Ambulatory Visit: Payer: Self-pay

## 2022-07-23 ENCOUNTER — Inpatient Hospital Stay (HOSPITAL_COMMUNITY): Payer: BC Managed Care – PPO

## 2022-07-23 ENCOUNTER — Encounter (HOSPITAL_COMMUNITY): Payer: Self-pay | Admitting: Obstetrics and Gynecology

## 2022-07-23 DIAGNOSIS — R0682 Tachypnea, not elsewhere classified: Secondary | ICD-10-CM | POA: Diagnosis present

## 2022-07-23 DIAGNOSIS — N179 Acute kidney failure, unspecified: Secondary | ICD-10-CM | POA: Diagnosis present

## 2022-07-23 DIAGNOSIS — O9049 Other postpartum acute kidney failure: Secondary | ICD-10-CM | POA: Diagnosis present

## 2022-07-23 DIAGNOSIS — B9620 Unspecified Escherichia coli [E. coli] as the cause of diseases classified elsewhere: Secondary | ICD-10-CM | POA: Diagnosis present

## 2022-07-23 DIAGNOSIS — A419 Sepsis, unspecified organism: Secondary | ICD-10-CM | POA: Diagnosis not present

## 2022-07-23 DIAGNOSIS — O8604 Sepsis following an obstetrical procedure: Secondary | ICD-10-CM | POA: Diagnosis present

## 2022-07-23 DIAGNOSIS — Z20822 Contact with and (suspected) exposure to covid-19: Secondary | ICD-10-CM | POA: Diagnosis present

## 2022-07-23 LAB — BLOOD CULTURE ID PANEL (REFLEXED) - BCID2

## 2022-07-23 LAB — CBC WITH DIFFERENTIAL/PLATELET
Abs Immature Granulocytes: 0.38 10*3/uL — ABNORMAL HIGH (ref 0.00–0.07)
Basophils Absolute: 0.1 10*3/uL (ref 0.0–0.1)
Basophils Relative: 0 %
Eosinophils Absolute: 0 10*3/uL (ref 0.0–0.5)
Eosinophils Relative: 0 %
HCT: 27.4 % — ABNORMAL LOW (ref 36.0–46.0)
Hemoglobin: 8.9 g/dL — ABNORMAL LOW (ref 12.0–15.0)
Immature Granulocytes: 2 %
Lymphocytes Relative: 13 %
Lymphs Abs: 2.2 10*3/uL (ref 0.7–4.0)
MCH: 28.4 pg (ref 26.0–34.0)
MCHC: 32.5 g/dL (ref 30.0–36.0)
MCV: 87.5 fL (ref 80.0–100.0)
Monocytes Absolute: 1.9 10*3/uL — ABNORMAL HIGH (ref 0.1–1.0)
Monocytes Relative: 11 %
Neutro Abs: 12.3 10*3/uL — ABNORMAL HIGH (ref 1.7–7.7)
Neutrophils Relative %: 74 %
Platelets: 89 10*3/uL — ABNORMAL LOW (ref 150–400)
RBC: 3.13 MIL/uL — ABNORMAL LOW (ref 3.87–5.11)
RDW: 15.2 % (ref 11.5–15.5)
WBC: 16.8 10*3/uL — ABNORMAL HIGH (ref 4.0–10.5)
nRBC: 0 % (ref 0.0–0.2)

## 2022-07-23 LAB — COMPREHENSIVE METABOLIC PANEL
ALT: 18 U/L (ref 0–44)
AST: 26 U/L (ref 15–41)
Albumin: 1.9 g/dL — ABNORMAL LOW (ref 3.5–5.0)
Alkaline Phosphatase: 123 U/L (ref 38–126)
Anion gap: 10 (ref 5–15)
BUN: 11 mg/dL (ref 6–20)
CO2: 20 mmol/L — ABNORMAL LOW (ref 22–32)
Calcium: 8.1 mg/dL — ABNORMAL LOW (ref 8.9–10.3)
Chloride: 102 mmol/L (ref 98–111)
Creatinine, Ser: 1.82 mg/dL — ABNORMAL HIGH (ref 0.44–1.00)
GFR, Estimated: 38 mL/min — ABNORMAL LOW (ref >60)
Glucose, Bld: 132 mg/dL — ABNORMAL HIGH (ref 70–99)
Potassium: 3.6 mmol/L (ref 3.5–5.1)
Sodium: 132 mmol/L — ABNORMAL LOW (ref 135–145)
Total Bilirubin: 0.4 mg/dL (ref 0.3–1.2)
Total Protein: 5.8 g/dL — ABNORMAL LOW (ref 6.5–8.1)

## 2022-07-23 LAB — RESP PANEL BY RT-PCR (RSV, FLU A&B, COVID)  RVPGX2
Influenza A by PCR: NEGATIVE
Influenza B by PCR: NEGATIVE
Resp Syncytial Virus by PCR: NEGATIVE
SARS Coronavirus 2 by RT PCR: NEGATIVE

## 2022-07-23 LAB — CREATININE, SERUM
Creatinine, Ser: 1.98 mg/dL — ABNORMAL HIGH (ref 0.44–1.00)
GFR, Estimated: 34 mL/min — ABNORMAL LOW (ref >60)

## 2022-07-23 LAB — TYPE AND SCREEN
ABO/RH(D): A POS
Antibody Screen: NEGATIVE

## 2022-07-23 LAB — PROTIME-INR
INR: 1.1 (ref 0.8–1.2)
Prothrombin Time: 14.2 seconds (ref 11.4–15.2)

## 2022-07-23 LAB — LACTIC ACID, PLASMA
Lactic Acid, Venous: 2.7 mmol/L (ref 0.5–1.9)
Lactic Acid, Venous: 1.3 mmol/L (ref 0.5–1.9)

## 2022-07-23 LAB — APTT: aPTT: 29 seconds (ref 24–36)

## 2022-07-23 MED ORDER — ENOXAPARIN SODIUM 30 MG/0.3ML IJ SOSY: 30.0000 mg | PREFILLED_SYRINGE | INTRAMUSCULAR | Status: DC

## 2022-07-23 MED ORDER — KETOROLAC TROMETHAMINE 30 MG/ML IJ SOLN: 30.0000 mg | Freq: Four times a day (QID) | INTRAMUSCULAR | Status: DC | PRN

## 2022-07-23 MED ORDER — ACETAMINOPHEN 325 MG PO TABS
650.0000 mg | ORAL_TABLET | ORAL | Status: DC | PRN
  Administered 2022-07-23 – 2022-07-25 (×6): 650 mg via ORAL
  Filled 2022-07-23 (×6): qty 2

## 2022-07-23 MED ORDER — SODIUM CHLORIDE 0.9 % IV SOLN
2.0000 g | INTRAVENOUS | Status: DC
  Administered 2022-07-23 – 2022-07-25 (×3): 2 g via INTRAVENOUS
  Filled 2022-07-23 (×3): qty 20

## 2022-07-23 MED ORDER — KETOROLAC TROMETHAMINE 30 MG/ML IJ SOLN
15.0000 mg | Freq: Four times a day (QID) | INTRAMUSCULAR | Status: DC | PRN
  Administered 2022-07-23 (×2): 15 mg via INTRAVENOUS
  Filled 2022-07-23 (×2): qty 1

## 2022-07-23 MED ORDER — LACTATED RINGERS IV SOLN: 125.0000 mL/h | INTRAVENOUS | Status: AC

## 2022-07-23 MED ORDER — LACTATED RINGERS IV SOLN
INTRAVENOUS | Status: DC
  Administered 2022-07-24: 75 mL/h via INTRAVENOUS

## 2022-07-23 MED ORDER — VANCOMYCIN HCL IN DEXTROSE 1-5 GM/200ML-% IV SOLN: 1000.0000 mg | INTRAVENOUS | Status: DC

## 2022-07-23 MED ORDER — LACTATED RINGERS IV BOLUS (SEPSIS): 800.0000 mL | Freq: Once | INTRAVENOUS | Status: DC

## 2022-07-23 MED ORDER — PRENATAL MULTIVITAMIN CH
1.0000 | ORAL_TABLET | Freq: Every day | ORAL | Status: DC
  Administered 2022-07-24 – 2022-07-26 (×3): 1 via ORAL
  Filled 2022-07-23 (×3): qty 1

## 2022-07-23 MED ORDER — METRONIDAZOLE 500 MG/100ML IV SOLN
500.0000 mg | Freq: Two times a day (BID) | INTRAVENOUS | Status: DC
  Administered 2022-07-23: 500 mg via INTRAVENOUS
  Filled 2022-07-23 (×2): qty 100

## 2022-07-23 MED ORDER — SODIUM CHLORIDE 0.9 % IV SOLN
2.0000 g | Freq: Two times a day (BID) | INTRAVENOUS | Status: DC
  Administered 2022-07-23: 2 g via INTRAVENOUS
  Filled 2022-07-23: qty 12.5

## 2022-07-23 MED ORDER — SODIUM CHLORIDE 0.9 % IV SOLN
2.0000 g | Freq: Once | INTRAVENOUS | Status: AC
  Administered 2022-07-23: 2 g via INTRAVENOUS
  Filled 2022-07-23: qty 12.5

## 2022-07-23 MED ORDER — OXYCODONE HCL 5 MG PO TABS
5.0000 mg | ORAL_TABLET | ORAL | Status: DC | PRN
  Administered 2022-07-23: 5 mg via ORAL
  Filled 2022-07-23: qty 1

## 2022-07-23 MED ORDER — HYDROMORPHONE HCL 1 MG/ML IJ SOLN
1.0000 mg | INTRAMUSCULAR | Status: DC | PRN
  Administered 2022-07-23 (×3): 1 mg via INTRAVENOUS
  Administered 2022-07-23: 0.5 mg via INTRAVENOUS
  Administered 2022-07-24 – 2022-07-25 (×12): 1 mg via INTRAVENOUS
  Filled 2022-07-23 (×18): qty 1

## 2022-07-23 MED ORDER — ONDANSETRON 4 MG PO TBDP
8.0000 mg | ORAL_TABLET | Freq: Once | ORAL | Status: AC
  Administered 2022-07-23: 8 mg via ORAL
  Filled 2022-07-23: qty 2

## 2022-07-23 MED ORDER — CALCIUM CARBONATE ANTACID 500 MG PO CHEW
2.0000 | CHEWABLE_TABLET | ORAL | Status: DC | PRN
  Administered 2022-07-24: 400 mg via ORAL
  Filled 2022-07-23: qty 2

## 2022-07-23 MED ORDER — LACTATED RINGERS IV BOLUS (SEPSIS): 1000.0000 mL | Freq: Once | INTRAVENOUS | Status: DC

## 2022-07-23 MED ORDER — VANCOMYCIN HCL IN DEXTROSE 1-5 GM/200ML-% IV SOLN
1000.0000 mg | Freq: Once | INTRAVENOUS | Status: DC
  Filled 2022-07-23: qty 200

## 2022-07-23 MED ORDER — KETOROLAC TROMETHAMINE 15 MG/ML IJ SOLN: 15.0000 mg | Freq: Once | INTRAMUSCULAR | Status: DC

## 2022-07-23 MED ORDER — SODIUM CHLORIDE 0.9 % IV SOLN
8.0000 mg | Freq: Once | INTRAVENOUS | Status: DC
  Filled 2022-07-23: qty 4

## 2022-07-23 MED ORDER — VANCOMYCIN HCL 2000 MG/400ML IV SOLN
2000.0000 mg | Freq: Once | INTRAVENOUS | Status: AC
  Administered 2022-07-23: 2000 mg via INTRAVENOUS
  Filled 2022-07-23: qty 400

## 2022-07-23 NOTE — MAU Note (Signed)
Liter bolus of NS complete and new bag NS hung at 125cc/hr

## 2022-07-23 NOTE — Progress Notes (Addendum)
CRITICAL VALUE STICKER  CRITICAL VALUE: lactic acid - 2.7  RECEIVER (on-site recipient of call): Victorino Dike, RN   DATE & TIME NOTIFIED: 07/23/2022 0041  MESSENGER (representative from lab): Glenard Haring   MD NOTIFIED: Milinda Cave, CNM   TIME OF NOTIFICATION: 07/23/2022 [id number]  RESPONSE: no new orders at this time.

## 2022-07-23 NOTE — Progress Notes (Signed)
PHARMACY - PHYSICIAN COMMUNICATION CRITICAL VALUE ALERT - BLOOD CULTURE IDENTIFICATION (BCID)  Jamie Simmons is an 32 y.o. female who presented to [facility] on 07/22/2022 with a chief complaint of flu-like symptoms, flank pain, and difficulty urinating.  Assessment:  Patient with Blood Cx's positive for Ecoli.  Name of physician (or Provider) Contacted: Dr. Rogue Bussing  Current antibiotics: Cefepime, Metronidazole, Vancomycin  Changes to prescribed antibiotics recommended:  Recommended to change to ceftriaxone 2 gm IV Q24hr. Recommendations accepted by provider  Results for orders placed or performed during the hospital encounter of 07/22/22  Blood Culture ID Panel (Reflexed) (Collected: 07/22/2022 11:23 PM)  Result Value Ref Range   Enterococcus faecalis NOT DETECTED NOT DETECTED   Enterococcus Faecium NOT DETECTED NOT DETECTED   Listeria monocytogenes NOT DETECTED NOT DETECTED   Staphylococcus species NOT DETECTED NOT DETECTED   Staphylococcus aureus (BCID) NOT DETECTED NOT DETECTED   Staphylococcus epidermidis NOT DETECTED NOT DETECTED   Staphylococcus lugdunensis NOT DETECTED NOT DETECTED   Streptococcus species NOT DETECTED NOT DETECTED   Streptococcus agalactiae NOT DETECTED NOT DETECTED   Streptococcus pneumoniae NOT DETECTED NOT DETECTED   Streptococcus pyogenes NOT DETECTED NOT DETECTED   A.calcoaceticus-baumannii NOT DETECTED NOT DETECTED   Bacteroides fragilis NOT DETECTED NOT DETECTED   Enterobacterales DETECTED (A) NOT DETECTED   Enterobacter cloacae complex NOT DETECTED NOT DETECTED   Escherichia coli DETECTED (A) NOT DETECTED   Klebsiella aerogenes NOT DETECTED NOT DETECTED   Klebsiella oxytoca NOT DETECTED NOT DETECTED   Klebsiella pneumoniae NOT DETECTED NOT DETECTED   Proteus species NOT DETECTED NOT DETECTED   Salmonella species NOT DETECTED NOT DETECTED   Serratia marcescens NOT DETECTED NOT DETECTED   Haemophilus influenzae NOT DETECTED NOT DETECTED    Neisseria meningitidis NOT DETECTED NOT DETECTED   Pseudomonas aeruginosa NOT DETECTED NOT DETECTED   Stenotrophomonas maltophilia NOT DETECTED NOT DETECTED   Candida albicans NOT DETECTED NOT DETECTED   Candida auris NOT DETECTED NOT DETECTED   Candida glabrata NOT DETECTED NOT DETECTED   Candida krusei NOT DETECTED NOT DETECTED   Candida parapsilosis NOT DETECTED NOT DETECTED   Candida tropicalis NOT DETECTED NOT DETECTED   Cryptococcus neoformans/gattii NOT DETECTED NOT DETECTED   CTX-M ESBL NOT DETECTED NOT DETECTED   Carbapenem resistance IMP NOT DETECTED NOT DETECTED   Carbapenem resistance KPC NOT DETECTED NOT DETECTED   Carbapenem resistance NDM NOT DETECTED NOT DETECTED   Carbapenem resist OXA 48 LIKE NOT DETECTED NOT DETECTED   Carbapenem resistance VIM NOT DETECTED NOT DETECTED    Beryle Lathe 07/23/2022  3:02 PM

## 2022-07-23 NOTE — H&P (Addendum)
Jamie Simmons is an 32 y.o. [phone] female who present to the hospital on PPD# 16 s/p svd with flu-like symptoms and report of "feeling bad". Pt was seen in MAU two days ago with similar sx and sent home. She specifically reports HA, back pain and difficulty urinating. She was discharged home pp with foley catheter in place on 07/10/22 - it was removed in office on 07/12/22. Pt has had feeling of incomplete voiding and been treating her discomfort with tylenol and ibuprofen - did not take today since no longer helping. Toradol in MAU gave some relief. Lochia normal. Reports diffuse body aches and feeling cold. No CP but mild SOB occasionally  Pertinent Gynecological History: Menses:  lochia wnl  OB History: G4, P1031   Menstrual History: Menarche age: 58 No LMP recorded.    Past Medical History:  Diagnosis Date   Attention deficit disorder    Migraine    Nephrolithiasis 02/06/2012    Past Surgical History:  Procedure Laterality Date   WISDOM TOOTH EXTRACTION      Family History  Problem Relation Age of Onset   Drug abuse Mother    Migraines Mother    Hypertension Mother    Diabetes Father    Obesity Father     Social History:  reports that she has never smoked. She has never used smokeless tobacco. She reports that she does not drink alcohol and does not use drugs.  Allergies: No Known Allergies  Medications Prior to Admission  Medication Sig Dispense Refill Last Dose   acetaminophen (TYLENOL) 500 MG tablet Take 1,000 mg by mouth every 6 (six) hours as needed for moderate pain.      citalopram (CELEXA) 20 MG tablet Take 20 mg by mouth daily.      ibuprofen (ADVIL) 600 MG tablet Take 1 tablet (600 mg total) by mouth every 6 (six) hours. 30 tablet 0    omeprazole (PRILOSEC) 20 MG capsule Take 20 mg by mouth daily. (Patient not taking: Reported on 07/22/2022)   Not Taking    Review of Systems  Constitutional:  Positive for activity change, chills, diaphoresis,  fatigue and fever.  Respiratory:  Negative for chest tightness and shortness of breath.   Cardiovascular:  Positive for palpitations. Negative for chest pain and leg swelling.  Gastrointestinal:  Positive for abdominal pain.  Genitourinary:  Negative for vaginal bleeding.  Musculoskeletal:  Positive for back pain.  Skin:  Positive for pallor.  Neurological:  Negative for headaches.  Psychiatric/Behavioral:  The patient is nervous/anxious.     Blood pressure 122/85, pulse 91, temperature 98 F (36.7 C), temperature source Oral, resp. rate (!) 22, height 5' 5"$  (1.651 m), weight 99.3 kg, SpO2 98 %, currently breastfeeding. Physical Exam Vitals and nursing note reviewed.  Constitutional:      Appearance: She is well-developed and normal weight. She is toxic-appearing.  Cardiovascular:     Rate and Rhythm: Tachycardia present.  Pulmonary:     Effort: Pulmonary effort is normal.  Musculoskeletal:        General: Normal range of motion.     Cervical back: Normal range of motion.  Skin:    Coloration: Skin is pale.  Neurological:     Mental Status: She is alert and oriented to person, place, and time.  Psychiatric:        Mood and Affect: Mood normal.        Speech: Speech normal.     Results for orders placed or performed  during the hospital encounter of 07/22/22 (from the past 24 hour(s))  Urinalysis, Routine w reflex microscopic -Urine, Clean Catch     Status: Abnormal   Collection Time: 07/22/22 10:40 PM  Result Value Ref Range   Color, Urine YELLOW YELLOW   APPearance HAZY (A) CLEAR   Specific Gravity, Urine 1.003 (L) 1.005 - 1.030   pH 7.0 5.0 - 8.0   Glucose, UA NEGATIVE NEGATIVE mg/dL   Hgb urine dipstick LARGE (A) NEGATIVE   Bilirubin Urine NEGATIVE NEGATIVE   Ketones, ur NEGATIVE NEGATIVE mg/dL   Protein, ur 100 (A) NEGATIVE mg/dL   Nitrite NEGATIVE NEGATIVE   Leukocytes,Ua LARGE (A) NEGATIVE   RBC / HPF 0-5 0 - 5 RBC/hpf   WBC, UA >50 0 - 5 WBC/hpf   Bacteria, UA  RARE (A) NONE SEEN   Squamous Epithelial / HPF 0-5 0 - 5 /HPF   WBC Clumps PRESENT    Mucus PRESENT   CBC with Differential/Platelet     Status: Abnormal   Collection Time: 07/22/22 11:15 PM  Result Value Ref Range   WBC 16.8 (H) 4.0 - 10.5 K/uL   RBC 3.13 (L) 3.87 - 5.11 MIL/uL   Hemoglobin 8.9 (L) 12.0 - 15.0 g/dL   HCT 27.4 (L) 36.0 - 46.0 %   MCV 87.5 80.0 - 100.0 fL   MCH 28.4 26.0 - 34.0 pg   MCHC 32.5 30.0 - 36.0 g/dL   RDW 15.2 11.5 - 15.5 %   Platelets 89 (L) 150 - 400 K/uL   nRBC 0.0 0.0 - 0.2 %   Neutrophils Relative % 74 %   Neutro Abs 12.3 (H) 1.7 - 7.7 K/uL   Lymphocytes Relative 13 %   Lymphs Abs 2.2 0.7 - 4.0 K/uL   Monocytes Relative 11 %   Monocytes Absolute 1.9 (H) 0.1 - 1.0 K/uL   Eosinophils Relative 0 %   Eosinophils Absolute 0.0 0.0 - 0.5 K/uL   Basophils Relative 0 %   Basophils Absolute 0.1 0.0 - 0.1 K/uL   Immature Granulocytes 2 %   Abs Immature Granulocytes 0.38 (H) 0.00 - 0.07 K/uL  Comprehensive metabolic panel     Status: Abnormal   Collection Time: 07/22/22 11:15 PM  Result Value Ref Range   Sodium 132 (L) 135 - 145 mmol/L   Potassium 3.6 3.5 - 5.1 mmol/L   Chloride 102 98 - 111 mmol/L   CO2 20 (L) 22 - 32 mmol/L   Glucose, Bld 132 (H) 70 - 99 mg/dL   BUN 11 6 - 20 mg/dL   Creatinine, Ser 1.82 (H) 0.44 - 1.00 mg/dL   Calcium 8.1 (L) 8.9 - 10.3 mg/dL   Total Protein 5.8 (L) 6.5 - 8.1 g/dL   Albumin 1.9 (L) 3.5 - 5.0 g/dL   AST 26 15 - 41 U/L   ALT 18 0 - 44 U/L   Alkaline Phosphatase 123 38 - 126 U/L   Total Bilirubin 0.4 0.3 - 1.2 mg/dL   GFR, Estimated 38 (L) >60 mL/min   Anion gap 10 5 - 15  Lactic acid, plasma     Status: Abnormal   Collection Time: 07/22/22 11:15 PM  Result Value Ref Range   Lactic Acid, Venous 2.7 (HH) 0.5 - 1.9 mmol/L  Protime-INR     Status: None   Collection Time: 07/22/22 11:15 PM  Result Value Ref Range   Prothrombin Time 14.2 11.4 - 15.2 seconds   INR 1.1 0.8 - 1.2  APTT  Status: None   Collection  Time: 07/22/22 11:15 PM  Result Value Ref Range   aPTT 29 24 - 36 seconds  Resp panel by RT-PCR (RSV, Flu A&B, Covid) Anterior Nasal Swab     Status: None   Collection Time: 07/23/22 12:04 AM   Specimen: Anterior Nasal Swab  Result Value Ref Range   SARS Coronavirus 2 by RT PCR NEGATIVE NEGATIVE   Influenza A by PCR NEGATIVE NEGATIVE   Influenza B by PCR NEGATIVE NEGATIVE   Resp Syncytial Virus by PCR NEGATIVE NEGATIVE  Type and screen [place]     Status: None   Collection Time: 07/23/22  3:10 AM  Result Value Ref Range   ABO/RH(D) A POS    Antibody Screen NEG    Sample Expiration      07/26/2022,2359 Performed at [facility], [place] [address] [place], [place] [address]   Lactic acid, plasma     Status: None   Collection Time: 07/23/22  3:17 AM  Result Value Ref Range   Lactic Acid, Venous 1.3 0.5 - 1.9 mmol/L    DG CHEST PORT 1 VIEW  Result Date: 07/23/2022 CLINICAL DATA:  Status post delivery 2 days ago, presenting with tachypnea and wheezing. EXAM: PORTABLE CHEST 1 VIEW COMPARISON:  August 19, 2027 FINDINGS: The heart size and mediastinal contours are within normal limits. Low lung volumes are noted with mild right infrahilar and left lateral basilar atelectasis. There is no evidence of a pleural effusion or pneumothorax. The visualized skeletal structures are unremarkable. IMPRESSION: Low lung volumes with mild right infrahilar and left lateral basilar atelectasis. Electronically Signed   By: Virgina Norfolk M.D.   On: 07/23/2022 00:36    Assessment/Plan: Jamie Simmons [id number] female on PPD#17 with sepsis - suspect urologic cause - Admit  - Blood and urine cultures sent - Sepsis protocol activated - Toradol for pain relief    Jamie Simmons 07/23/2022, 5:22 AM

## 2022-07-23 NOTE — Consult Note (Addendum)
JADIAH BARNABA is a 32 y.o. female admitted on 07/22/2022 10:08 PM  with sepsis. Pharmacy has been consulted for Cefepime and Vancomycin dosing.  Plan: Vancomycin 2g IV x 1; then 1025m IV q24h. Goal AUC 400-550. Expected AUC: 527.8 SCr used: 1.82  Cefepime 2gm q12h  Due to AKI and Vancomycin therapy, recommended that we limit NSAID therapy (Toradol, Ibuprofen, etc) until Scr is back to baseline. Concern was directed to Dr BTerri Piedra Repeat Scr ordered as repeat lactic acid is improved and renal function could be improved from hydration to help with antibiotic dosing adjustments if needed.  Ht Readings from Last 1 Encounters:  07/22/22 5' 5"$  (1.651 m)    99.3 kg (219 lb)  Ideal body weight: 57 kg (125 lb 10.6 oz) Adjusted ideal body weight: 73.9 kg (163 lb)   Temp: 98.2 F (36.8 C) (02/16 0213) Temp Source: Oral (02/16 0213) BP: 103/56 (02/16 0213) Pulse Rate: 94 (02/16 0213)   Recent Labs    07/22/22 2315  WBC 16.8*  CREATININE 1.82*   Estimated Creatinine Clearance: 52.7 mL/min (A) (by C-G formula based on SCr of 1.82 mg/dL (H)).  Allergies: Patient has no known allergies.   Antimicrobials this admission: CTX 2gm 2/15 >> 2/16 Cefepime 2/16 >>  Flagyl 2/16 >> Vanc 2/16 >>   Microbiology results: 2/15 BCx pending 2/15 UCx pending  Thank you for allowing pharmacy to be a part of this patient's care.  HBurns Spain2/16/2024 2:35 AM

## 2022-07-23 NOTE — Sepsis Progress Note (Signed)
Following for sepsis monitoring ?

## 2022-07-23 NOTE — MAU Note (Signed)
Antibiotics complete so IV rate up to 999 to deliver NS bolus of remaining liter

## 2022-07-23 NOTE — Plan of Care (Signed)
  Problem: Activity: Goal: Risk for activity intolerance will decrease Outcome: Progressing   Problem: Nutrition: Goal: Adequate nutrition will be maintained Outcome: Progressing   Problem: Education: Goal: Knowledge of General Education information will improve Description: Including pain rating scale, medication(s)/side effects and non-pharmacologic comfort measures Outcome: Completed/Met   Problem: Coping: Goal: Level of anxiety will decrease Outcome: Completed/Met   

## 2022-07-23 NOTE — Progress Notes (Signed)
Patient sleeping upon arrival.  She was aroused and groggy, reported pain improved with dilaudid, now able to sleep well for the first time in days.  Still with bilateral flank pain but generalized pain like prior not reported.  No CP/SOB, no calf tenderness.  Na HA, no GI complaints.  No breast complaints.  Vitals:   07/23/22 0607 07/23/22 0826 07/23/22 0836 07/23/22 1034  BP: 117/66 100/62  (!) 141/93  Pulse: 76 100  (!) 117  Resp: (!) 22 14  18  $ Temp:  98.6 F (37 C)  99.4 F (37.4 C)  TempSrc:  Oral  Oral  SpO2: 98% 93% 96% 100%  Weight:      Height:       Gen: NAD, resting comfortably Fundus firm Abd: nontender Ext: no calf tenderness  Lab Results  Component Value Date   WBC 16.8 (H) 07/22/2022   HGB 8.9 (L) 07/22/2022   HCT 27.4 (L) 07/22/2022   MCV 87.5 07/22/2022   PLT 89 (L) 07/22/2022    --/--/A POS (02/16 0310)  A/P Post partum day 17, HD #1 sepsis with suspected urologic origin. -See H&P for admission detail. Urine culture pending, blood culture pending; continue cefepime, vanco and flagyl per sepsis protocol for at least 24 hrs.  Lactic acid improved on recheck.  Will repeat CBC in AM -Cr worsened from 1.87 to 1.98, advised RN to d/c toradol. Will avoid NSAIDS.  Recheck CMP in am. Dilaudid 16m q3 hrs prn pain, pt does not tolerate tylenol. -Other routine care.    SAllyn Kenner

## 2022-07-24 ENCOUNTER — Inpatient Hospital Stay (HOSPITAL_COMMUNITY): Payer: BC Managed Care – PPO

## 2022-07-24 ENCOUNTER — Encounter (HOSPITAL_COMMUNITY): Payer: Self-pay | Admitting: Obstetrics and Gynecology

## 2022-07-24 LAB — CBC WITH DIFFERENTIAL/PLATELET
Abs Immature Granulocytes: 1.1 10*3/uL — ABNORMAL HIGH (ref 0.00–0.07)
Basophils Absolute: 0 10*3/uL (ref 0.0–0.1)
Basophils Relative: 0 %
Eosinophils Absolute: 0.4 10*3/uL (ref 0.0–0.5)
Eosinophils Relative: 2 %
HCT: 30.1 % — ABNORMAL LOW (ref 36.0–46.0)
Hemoglobin: 9.3 g/dL — ABNORMAL LOW (ref 12.0–15.0)
Lymphocytes Relative: 17 %
Lymphs Abs: 3.1 10*3/uL (ref 0.7–4.0)
MCH: 28.3 pg (ref 26.0–34.0)
MCHC: 30.9 g/dL (ref 30.0–36.0)
MCV: 91.5 fL (ref 80.0–100.0)
Metamyelocytes Relative: 4 %
Monocytes Absolute: 1.4 10*3/uL — ABNORMAL HIGH (ref 0.1–1.0)
Monocytes Relative: 8 %
Neutro Abs: 12.1 10*3/uL — ABNORMAL HIGH (ref 1.7–7.7)
Neutrophils Relative %: 67 %
Platelets: 152 10*3/uL (ref 150–400)
Promyelocytes Relative: 2 %
RBC: 3.29 MIL/uL — ABNORMAL LOW (ref 3.87–5.11)
RDW: 15.4 % (ref 11.5–15.5)
WBC: 18 10*3/uL — ABNORMAL HIGH (ref 4.0–10.5)
nRBC: 0 % (ref 0.0–0.2)
nRBC: 0 /100 WBC

## 2022-07-24 LAB — COMPREHENSIVE METABOLIC PANEL
ALT: 20 U/L (ref 0–44)
AST: 24 U/L (ref 15–41)
Albumin: 1.9 g/dL — ABNORMAL LOW (ref 3.5–5.0)
Alkaline Phosphatase: 132 U/L — ABNORMAL HIGH (ref 38–126)
Anion gap: 13 (ref 5–15)
BUN: 18 mg/dL (ref 6–20)
CO2: 20 mmol/L — ABNORMAL LOW (ref 22–32)
Calcium: 7.6 mg/dL — ABNORMAL LOW (ref 8.9–10.3)
Chloride: 103 mmol/L (ref 98–111)
Creatinine, Ser: 2.34 mg/dL — ABNORMAL HIGH (ref 0.44–1.00)
GFR, Estimated: 28 mL/min — ABNORMAL LOW (ref >60)
Glucose, Bld: 94 mg/dL (ref 70–99)
Potassium: 4.4 mmol/L (ref 3.5–5.1)
Sodium: 136 mmol/L (ref 135–145)
Total Bilirubin: 0.4 mg/dL (ref 0.3–1.2)
Total Protein: 6.2 g/dL — ABNORMAL LOW (ref 6.5–8.1)

## 2022-07-24 LAB — PROTEIN / CREATININE RATIO, URINE
Creatinine, Urine: 26 mg/dL
Protein Creatinine Ratio: 1.12 mg/mg{Cre} — ABNORMAL HIGH (ref 0.00–0.15)
Total Protein, Urine: 29 mg/dL

## 2022-07-24 LAB — URINALYSIS, COMPLETE (UACMP) WITH MICROSCOPIC
Bilirubin Urine: NEGATIVE
Glucose, UA: NEGATIVE mg/dL
Ketones, ur: NEGATIVE mg/dL
Nitrite: NEGATIVE
Protein, ur: NEGATIVE mg/dL
Specific Gravity, Urine: <1.005 — ABNORMAL LOW (ref 1.005–1.030)
pH: 5.5 (ref 5.0–8.0)

## 2022-07-24 LAB — SODIUM, URINE, RANDOM: Sodium, Ur: 31 mmol/L

## 2022-07-24 LAB — CREATININE, URINE, RANDOM: Creatinine, Urine: 26 mg/dL

## 2022-07-24 MED ORDER — ONDANSETRON HCL 4 MG/2ML IJ SOLN
4.0000 mg | Freq: Four times a day (QID) | INTRAMUSCULAR | Status: DC | PRN
  Administered 2022-07-24 – 2022-07-26 (×4): 4 mg via INTRAVENOUS
  Filled 2022-07-24 (×4): qty 2

## 2022-07-24 NOTE — Progress Notes (Signed)
Patient ID: Jamie Simmons, female   DOB: Apr 14, 1991, 32 y.o.   MRN: [id number]  HD #2, PPD #17 Admitted with urosepsis  S: Pt sitting up in bed talking, still complaining of body aches and chills. Father in room with patient, states this is the best she's looked in several days. Now able to converse in complete sentences  O:  Vitals:   07/24/22 0900 07/24/22 0904 07/24/22 1102 07/24/22 1303  BP: 109/69 109/69 117/71 107/73  Pulse: 87 85 85 92  Resp: 16  18 18  $ Temp: 97.9 F (36.6 C)  98 F (36.7 C) 98.9 F (37.2 C)  TempSrc: Oral  Oral Oral  SpO2: 97%  98% 99%  Weight:      Height:       Tm 101.5 @ 0503 Total I/O In: 720 [P.O.:720] Out: 700 [Urine:700]  AOX3, speech somewhat slurred Abd soft  Results for orders placed or performed during the hospital encounter of 07/22/22 (from the past 24 hour(s))  CBC with Differential/Platelet     Status: Abnormal   Collection Time: 07/24/22  4:17 AM  Result Value Ref Range   WBC 18.0 (H) 4.0 - 10.5 K/uL   RBC 3.29 (L) 3.87 - 5.11 MIL/uL   Hemoglobin 9.3 (L) 12.0 - 15.0 g/dL   HCT 30.1 (L) 36.0 - 46.0 %   MCV 91.5 80.0 - 100.0 fL   MCH 28.3 26.0 - 34.0 pg   MCHC 30.9 30.0 - 36.0 g/dL   RDW 15.4 11.5 - 15.5 %   Platelets 152 150 - 400 K/uL   nRBC 0.0 0.0 - 0.2 %   Neutrophils Relative % 67 %   Neutro Abs 12.1 (H) 1.7 - 7.7 K/uL   Lymphocytes Relative 17 %   Lymphs Abs 3.1 0.7 - 4.0 K/uL   Monocytes Relative 8 %   Monocytes Absolute 1.4 (H) 0.1 - 1.0 K/uL   Eosinophils Relative 2 %   Eosinophils Absolute 0.4 0.0 - 0.5 K/uL   Basophils Relative 0 %   Basophils Absolute 0.0 0.0 - 0.1 K/uL   WBC Morphology See Note    nRBC 0 0 /100 WBC   Metamyelocytes Relative 4 %   Promyelocytes Relative 2 %   Abs Immature Granulocytes 1.10 (H) 0.00 - 0.07 K/uL  Comprehensive metabolic panel     Status: Abnormal   Collection Time: 07/24/22  4:17 AM  Result Value Ref Range   Sodium 136 135 - 145 mmol/L   Potassium 4.4 3.5 - 5.1 mmol/L    Chloride 103 98 - 111 mmol/L   CO2 20 (L) 22 - 32 mmol/L   Glucose, Bld 94 70 - 99 mg/dL   BUN 18 6 - 20 mg/dL   Creatinine, Ser 2.34 (H) 0.44 - 1.00 mg/dL   Calcium 7.6 (L) 8.9 - 10.3 mg/dL   Total Protein 6.2 (L) 6.5 - 8.1 g/dL   Albumin 1.9 (L) 3.5 - 5.0 g/dL   AST 24 15 - 41 U/L   ALT 20 0 - 44 U/L   Alkaline Phosphatase 132 (H) 38 - 126 U/L   Total Bilirubin 0.4 0.3 - 1.2 mg/dL   GFR, Estimated 28 (L) >60 mL/min   Anion gap 13 5 - 15   Blood Culture E. Coli + Urine Culture E. 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 Echogenicity within normal limits. No mass or hydronephrosis visualized.  Left Kidney:  Renal measurements: 12.6 x 6 x 5.5 cm = volume: 215 mL. Echogenicity within normal limits. No mass or hydronephrosis visualized.  Bladder:  Physiologically distended.  Ureteral jets were not documented.  Other:  None.  IMPRESSION: Negative.  No hydronephrosis.   Electronically Signed By: Lucrezia Europe M.D. On: 07/24/2022 10:12  A/P: PPD #17 admitted with urosepsis 1) Urosepsis: E coli + on blood and urine cultures. Antibiotics changed from Vanc/Metro/Cefepime, to ceftriaxone 2gm Q 24 hrs. Tmax 101.5 @ 0500. WBCs 16.8 --> 18K, essentially unchanged. Pt had failed a voiding trial after foley catheter removed on 07/12/22 due to urinary retention PP, Pt refused to have foley replaced after failing voiding trial. Pt now voiding 200-300 cc Q2-3 hours. Renal US today shows normal kidneys bilaterally, no hydronephrosis. Ureteral jets were not documented, bladder was physiologically distended 2) AKI: Cr 1.8-->1.98-->2.34 this am. All nephrotoxic medications stopped. Suspect AKI due to infection. 3) Pain management: NSAIDS discontinued. Pt  receiving IV dilaudid

## 2022-07-24 NOTE — Plan of Care (Signed)
  Problem: Clinical Measurements: Goal: Diagnostic test results will improve Outcome: Progressing Goal: Signs and symptoms of infection will decrease Outcome: Progressing   Problem: Education: Goal: Knowledge of disease or condition will improve Outcome: Progressing Goal: Knowledge of the prescribed therapeutic regimen will improve Outcome: Progressing Goal: Individualized Educational Video(s) Outcome: Progressing   Problem: Clinical Measurements: Goal: Complications related to the disease process, condition or treatment will be avoided or minimized Outcome: Progressing   Problem: Health Behavior/Discharge Planning: Goal: Ability to manage health-related needs will improve Outcome: Progressing   Problem: Clinical Measurements: Goal: Ability to maintain clinical measurements within normal limits will improve Outcome: Progressing Goal: Will remain free from infection Outcome: Progressing Goal: Diagnostic test results will improve Outcome: Progressing Goal: Respiratory complications will improve Outcome: Progressing Goal: Cardiovascular complication will be avoided Outcome: Progressing   Problem: Activity: Goal: Risk for activity intolerance will decrease Outcome: Progressing   Problem: Nutrition: Goal: Adequate nutrition will be maintained Outcome: Progressing   Problem: Elimination: Goal: Will not experience complications related to bowel motility Outcome: Progressing Goal: Will not experience complications related to urinary retention Outcome: Progressing   Problem: Pain Managment: Goal: General experience of comfort will improve Outcome: Progressing   Problem: Safety: Goal: Ability to remain free from injury will improve Outcome: Progressing   Problem: Skin Integrity: Goal: Risk for impaired skin integrity will decrease Outcome: Progressing   Problem: Clinical Measurements: Goal: Diagnostic test results will improve Outcome: Progressing Goal: Signs and  symptoms of infection will decrease Outcome: Progressing

## 2022-07-25 LAB — CBC
HCT: 23.8 % — ABNORMAL LOW (ref 36.0–46.0)
Hemoglobin: 7.8 g/dL — ABNORMAL LOW (ref 12.0–15.0)
MCH: 28.3 pg (ref 26.0–34.0)
MCHC: 32.8 g/dL (ref 30.0–36.0)
MCV: 86.2 fL (ref 80.0–100.0)
Platelets: 183 10*3/uL (ref 150–400)
RBC: 2.76 MIL/uL — ABNORMAL LOW (ref 3.87–5.11)
RDW: 15.2 % (ref 11.5–15.5)
WBC: 10.6 10*3/uL — ABNORMAL HIGH (ref 4.0–10.5)
nRBC: 0 % (ref 0.0–0.2)

## 2022-07-25 LAB — COMPREHENSIVE METABOLIC PANEL
ALT: 22 U/L (ref 0–44)
AST: 22 U/L (ref 15–41)
Albumin: <1.5 g/dL — ABNORMAL LOW (ref 3.5–5.0)
Alkaline Phosphatase: 117 U/L (ref 38–126)
Anion gap: 9 (ref 5–15)
BUN: 14 mg/dL (ref 6–20)
CO2: 22 mmol/L (ref 22–32)
Calcium: 7.3 mg/dL — ABNORMAL LOW (ref 8.9–10.3)
Chloride: 105 mmol/L (ref 98–111)
Creatinine, Ser: 1.95 mg/dL — ABNORMAL HIGH (ref 0.44–1.00)
GFR, Estimated: 35 mL/min — ABNORMAL LOW (ref >60)
Glucose, Bld: 85 mg/dL (ref 70–99)
Potassium: 3.5 mmol/L (ref 3.5–5.1)
Sodium: 136 mmol/L (ref 135–145)
Total Bilirubin: <0.1 mg/dL — ABNORMAL LOW (ref 0.3–1.2)
Total Protein: 5.3 g/dL — ABNORMAL LOW (ref 6.5–8.1)

## 2022-07-25 LAB — CULTURE, BLOOD (ROUTINE X 2)
Special Requests: ADEQUATE
Special Requests: ADEQUATE

## 2022-07-25 LAB — URINE CULTURE: Culture: >=100000 — AB

## 2022-07-25 MED ORDER — LORAZEPAM 1 MG PO TABS
1.0000 mg | ORAL_TABLET | Freq: Once | ORAL | Status: AC
  Administered 2022-07-25: 1 mg via ORAL
  Filled 2022-07-25: qty 1

## 2022-07-25 MED ORDER — OXYCODONE HCL 5 MG PO TABS
10.0000 mg | ORAL_TABLET | ORAL | Status: DC | PRN
  Administered 2022-07-25 – 2022-07-26 (×4): 10 mg via ORAL
  Filled 2022-07-25 (×6): qty 2

## 2022-07-25 MED ORDER — LORAZEPAM 1 MG PO TABS
1.0000 mg | ORAL_TABLET | Freq: Four times a day (QID) | ORAL | Status: DC | PRN
  Administered 2022-07-26: 1 mg via ORAL
  Filled 2022-07-25: qty 1

## 2022-07-25 NOTE — Progress Notes (Signed)
Patient ID: Jamie Simmons, female   DOB: 11/02/1990, 32 y.o.   MRN: [id number]  HD #3, PPD #1 Admitted with urosepsis  S: Patient has had 2 episodes of acute anxiety.  Both episodes seem to proceed a temperature spike.  Continues to have systemic muscle aches and muscle pain.  Conversive and ambulatory.  Tolerating p.o. without nausea and vomiting.  Voiding without difficulty. O:  Vitals:   07/25/22 0810 07/25/22 0811 07/25/22 1015 07/25/22 1216  BP:  131/66 127/74 121/72  Pulse:  (!) 114 (!) 101 89  Resp:  17 17 18  $ Temp:  98.4 F (36.9 C) (!) 102 F (38.9 C) 99 F (37.2 C)  TempSrc:  Oral Oral Oral  SpO2: 96% 100%  94%  Weight:      Height:       Tm 103 @ 0000 Total I/O In: 840 [P.O.:840] Out: 900 [Urine:900]   AOX3, patient does not make eye contact Converses with a childlike quality Abd soft  Results for orders placed or performed during the hospital encounter of 07/22/22 (from the past 24 hour(s))  Urinalysis, Complete w Microscopic -Urine, Clean Catch     Status: Abnormal   Collection Time: 07/24/22  2:45 PM  Result Value Ref Range   Color, Urine YELLOW YELLOW   APPearance CLEAR CLEAR   Specific Gravity, Urine <1.005 (L) 1.005 - 1.030   pH 5.5 5.0 - 8.0   Glucose, UA NEGATIVE NEGATIVE mg/dL   Hgb urine dipstick MODERATE (A) NEGATIVE   Bilirubin Urine NEGATIVE NEGATIVE   Ketones, ur NEGATIVE NEGATIVE mg/dL   Protein, ur NEGATIVE NEGATIVE mg/dL   Nitrite NEGATIVE NEGATIVE   Leukocytes,Ua MODERATE (A) NEGATIVE   Squamous Epithelial / HPF 0-5 0 - 5 /HPF   WBC, UA 21-50 0 - 5 WBC/hpf   RBC / HPF 0-5 0 - 5 RBC/hpf   Bacteria, UA FEW (A) NONE SEEN   Mucus PRESENT    Amorphous Crystal PRESENT   Sodium, urine, random     Status: None   Collection Time: 07/24/22  2:45 PM  Result Value Ref Range   Sodium, Ur 31 mmol/L  Creatinine, urine, random     Status: None   Collection Time: 07/24/22  2:45 PM  Result Value Ref Range   Creatinine, Urine 26 mg/dL  Protein /  creatinine ratio, urine     Status: Abnormal   Collection Time: 07/24/22  2:46 PM  Result Value Ref Range   Creatinine, Urine 26 mg/dL   Total Protein, Urine 29 mg/dL   Protein Creatinine Ratio 1.12 (H) 0.00 - 0.15 mg/mg[Cre]  CBC     Status: Abnormal   Collection Time: 07/25/22  4:15 AM  Result Value Ref Range   WBC 10.6 (H) 4.0 - 10.5 K/uL   RBC 2.76 (L) 3.87 - 5.11 MIL/uL   Hemoglobin 7.8 (L) 12.0 - 15.0 g/dL   HCT 23.8 (L) 36.0 - 46.0 %   MCV 86.2 80.0 - 100.0 fL   MCH 28.3 26.0 - 34.0 pg   MCHC 32.8 30.0 - 36.0 g/dL   RDW 15.2 11.5 - 15.5 %   Platelets 183 150 - 400 K/uL   nRBC 0.0 0.0 - 0.2 %  Comprehensive metabolic panel     Status: Abnormal   Collection Time: 07/25/22  4:15 AM  Result Value Ref Range   Sodium 136 135 - 145 mmol/L   Potassium 3.5 3.5 - 5.1 mmol/L   Chloride 105 98 - 111 mmol/L  CO2 22 22 - 32 mmol/L   Glucose, Bld 85 70 - 99 mg/dL   BUN 14 6 - 20 mg/dL   Creatinine, Ser 1.95 (H) 0.44 - 1.00 mg/dL   Calcium 7.3 (L) 8.9 - 10.3 mg/dL   Total Protein 5.3 (L) 6.5 - 8.1 g/dL   Albumin <1.5 (L) 3.5 - 5.0 g/dL   AST 22 15 - 41 U/L   ALT 22 0 - 44 U/L   Alkaline Phosphatase 117 38 - 126 U/L   Total Bilirubin <0.1 (L) 0.3 - 1.2 mg/dL   GFR, Estimated 35 (L) >60 mL/min   Anion gap 9 5 - 15   Blood Culture E. Coli + Urine Culture E. 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 Echogenicity within normal limits. No mass or hydronephrosis visualized.  Left Kidney:  Renal measurements: 12.6 x 6 x 5.5 cm = volume: 215 mL. Echogenicity within normal limits. No mass or hydronephrosis visualized.  Bladder:  Physiologically distended.  Ureteral jets were not  documented.  Other:  None.  IMPRESSION: Negative.  No hydronephrosis.   Electronically Signed By: Lucrezia Europe M.D. On: 07/24/2022 10:12  A/P: PPD #18 admitted with urosepsis 1) Urosepsis: E coli + on blood and urine cultures. Antibiotics changed from Vanc/Metro/Cefepime, to ceftriaxone 2gm Q 24 hrs.  - WBC improved significantly from 18K to 10.6K - Pt had failed a voiding trial after foley catheter removed on 07/12/22 due to urinary retention PP, Pt refused to have foley replaced after failing voiding trial. Pt now voiding 200-300 cc Q2-3 hours.  - Renal US 2/17 shows normal kidneys bilaterally, no hydronephrosis. Ureteral jets were not documented, bladder was physiologically distended 2) AKI: Cr 1.8-->1.98-->2.34-->1.98 this am. Creatinine improving - All nephrotoxic medications stopped.  - Suspect AKI due to infection. 3) Pain management: NSAIDS discontinued. Pt receiving IV dilaudid

## 2022-07-26 LAB — COMPREHENSIVE METABOLIC PANEL
ALT: 24 U/L (ref 0–44)
AST: 27 U/L (ref 15–41)
Albumin: 1.7 g/dL — ABNORMAL LOW (ref 3.5–5.0)
Alkaline Phosphatase: 115 U/L (ref 38–126)
Anion gap: 12 (ref 5–15)
BUN: 7 mg/dL (ref 6–20)
CO2: 23 mmol/L (ref 22–32)
Calcium: 7.5 mg/dL — ABNORMAL LOW (ref 8.9–10.3)
Chloride: 103 mmol/L (ref 98–111)
Creatinine, Ser: 1.7 mg/dL — ABNORMAL HIGH (ref 0.44–1.00)
GFR, Estimated: 41 mL/min — ABNORMAL LOW (ref >60)
Glucose, Bld: 96 mg/dL (ref 70–99)
Potassium: 3.9 mmol/L (ref 3.5–5.1)
Sodium: 138 mmol/L (ref 135–145)
Total Bilirubin: 0.5 mg/dL (ref 0.3–1.2)
Total Protein: 5.7 g/dL — ABNORMAL LOW (ref 6.5–8.1)

## 2022-07-26 LAB — CBC
HCT: 22.3 % — ABNORMAL LOW (ref 36.0–46.0)
Hemoglobin: 7.3 g/dL — ABNORMAL LOW (ref 12.0–15.0)
MCH: 28.3 pg (ref 26.0–34.0)
MCHC: 32.7 g/dL (ref 30.0–36.0)
MCV: 86.4 fL (ref 80.0–100.0)
Platelets: 364 10*3/uL (ref 150–400)
RBC: 2.58 MIL/uL — ABNORMAL LOW (ref 3.87–5.11)
RDW: 15 % (ref 11.5–15.5)
WBC: 11.6 10*3/uL — ABNORMAL HIGH (ref 4.0–10.5)
nRBC: 0 % (ref 0.0–0.2)

## 2022-07-26 MED ORDER — POLYSACCHARIDE IRON COMPLEX 150 MG PO CAPS: 150.0000 mg | ORAL_CAPSULE | Freq: Every day | ORAL | 3 refills | Status: AC

## 2022-07-26 MED ORDER — POLYSACCHARIDE IRON COMPLEX 150 MG PO CAPS
150.0000 mg | ORAL_CAPSULE | Freq: Every day | ORAL | Status: DC
  Administered 2022-07-26: 150 mg via ORAL
  Filled 2022-07-26: qty 1

## 2022-07-26 MED ORDER — OXYCODONE HCL 5 MG PO TABS: 5.0000 mg | ORAL_TABLET | Freq: Four times a day (QID) | ORAL | 0 refills | Status: DC | PRN

## 2022-07-26 MED ORDER — ONDANSETRON HCL 4 MG PO TABS: 4.0000 mg | ORAL_TABLET | Freq: Three times a day (TID) | ORAL | 0 refills | Status: DC | PRN

## 2022-07-26 MED ORDER — CEPHALEXIN 500 MG PO CAPS: 500.0000 mg | ORAL_CAPSULE | Freq: Three times a day (TID) | ORAL | 0 refills | Status: AC

## 2022-07-26 NOTE — Progress Notes (Signed)
HD #4, PPD #19, urosepsis Feeling better, no chills, pain improved, wants to go home Afeb for 22 hrs, VSS Abd-NT  CBC    Component Value Date/Time   WBC 11.6 (H) 07/26/2022 0524   RBC 2.58 (L) 07/26/2022 0524   HGB 7.3 (L) 07/26/2022 0524   HGB 14.9 06/08/2017 1209   HCT 22.3 (L) 07/26/2022 0524   HCT 43.4 06/08/2017 1209   PLT 364 07/26/2022 0524   PLT 345 06/08/2017 1209   MCV 86.4 07/26/2022 0524   MCV 89 06/08/2017 1209   MCH 28.3 07/26/2022 0524   MCHC 32.7 07/26/2022 0524   RDW 15.0 07/26/2022 0524   RDW 13.2 06/08/2017 1209   LYMPHSABS 3.1 07/24/2022 0417   LYMPHSABS 2.3 06/08/2017 1209   MONOABS 1.4 (H) 07/24/2022 0417   EOSABS 0.4 07/24/2022 0417   EOSABS 0.2 06/08/2017 1209   BASOSABS 0.0 07/24/2022 0417   BASOSABS 0.0 06/08/2017 1209   CMP     Component Value Date/Time   NA 138 07/26/2022 0524   NA 142 06/08/2017 1209   K 3.9 07/26/2022 0524   CL 103 07/26/2022 0524   CO2 23 07/26/2022 0524   GLUCOSE 96 07/26/2022 0524   BUN 7 07/26/2022 0524   BUN 10 06/08/2017 1209   CREATININE 1.70 (H) 07/26/2022 0524   CALCIUM 7.5 (L) 07/26/2022 0524   PROT 5.7 (L) 07/26/2022 0524   PROT 7.2 06/08/2017 1209   ALBUMIN 1.7 (L) 07/26/2022 0524   ALBUMIN 4.2 06/08/2017 1209   AST 27 07/26/2022 0524   ALT 24 07/26/2022 0524   ALKPHOS 115 07/26/2022 0524   BILITOT 0.5 07/26/2022 0524   BILITOT 0.3 06/08/2017 1209   GFRNONAA 41 (L) 07/26/2022 0524   GFRAA 110 06/08/2017 1209   Improving.  Cre trending down, afebrile for about 22 hrs currently.  Will continue Rocephin today, I will recheck this pm and see if stable enough for discharge.  Will start Niferex for anemia.

## 2022-07-26 NOTE — Discharge Instructions (Signed)
Call for fever or feeling worse.  Make sure to take all antibiotics until finished

## 2022-07-27 NOTE — Discharge Summary (Signed)
Physician Discharge Summary  Patient ID: Jamie Simmons MRN: [id number] DOB/AGE: 08/20/1990 32 y.o.  Admit date: 07/22/2022 Discharge date: 07/26/2022  Admission Diagnoses:  Urosepsis  Discharge Diagnoses: Same Principal Problem:   Sepsis after obstetrical procedure Active Problems:   Sepsis [facility])   Discharged Condition: good  Hospital Course: Pt admitted on 2-16 with flu-like symptoms and body aches.  UA and CBC c/w urosepsis.  She was admitted and put on Cefipime, vancomycin and flagyl.  She had fever off and on.  Blood and urine cx came back positive for E.coli, pansensitive, so antibiotics changed to Rocephin.  On 2-19, she had been afebrile for >24 hrs, was feeling better, and requested discharge.  She was felt to be stable for discharge.  Discharge Exam: Blood pressure 138/80, pulse 97, temperature 98.1 F (36.7 C), temperature source Oral, resp. rate 18, height 5' 5"$  (1.651 m), weight 99.3 kg, SpO2 100 %, currently breastfeeding. General appearance: alert  Disposition: Discharge disposition: 01-Home or Self Care       Discharge Instructions     Diet - low sodium heart healthy   Complete by: As directed       Allergies as of 07/26/2022   No Known Allergies      Medication List     STOP taking these medications    ibuprofen 600 MG tablet Commonly known as: ADVIL       TAKE these medications    acetaminophen 500 MG tablet Commonly known as: TYLENOL Take 1,000 mg by mouth every 6 (six) hours as needed for moderate pain.   cephALEXin 500 MG capsule Commonly known as: Keflex Take 1 capsule (500 mg total) by mouth 3 (three) times daily for 10 days.   citalopram 20 MG tablet Commonly known as: CELEXA Take 20 mg by mouth daily.   iron polysaccharides 150 MG capsule Commonly known as: NIFEREX Take 1 capsule (150 mg total) by mouth daily.   omeprazole 20 MG capsule Commonly known as: PRILOSEC Take 20 mg by mouth daily.   ondansetron 4 MG  tablet Commonly known as: Zofran Take 1 tablet (4 mg total) by mouth every 8 (eight) hours as needed for nausea or vomiting.   oxyCODONE 5 MG immediate release tablet Commonly known as: Oxy IR/ROXICODONE Take 1 tablet (5 mg total) by mouth every 6 (six) hours as needed for severe pain.        Follow-up Information     Ob/Gyn, [facility]. Call in 1 week(s).   Contact information: [place] [address] [phone]                 Signed: Blane Ohara Taylon Coole 07/27/2022, 5:24 AM

## 2024-05-23 ENCOUNTER — Other Ambulatory Visit: Payer: Self-pay

## 2024-05-23 ENCOUNTER — Encounter (HOSPITAL_BASED_OUTPATIENT_CLINIC_OR_DEPARTMENT_OTHER): Payer: Self-pay

## 2024-05-23 ENCOUNTER — Emergency Department (HOSPITAL_BASED_OUTPATIENT_CLINIC_OR_DEPARTMENT_OTHER)

## 2024-05-23 ENCOUNTER — Ambulatory Visit (HOSPITAL_BASED_OUTPATIENT_CLINIC_OR_DEPARTMENT_OTHER)
Admission: EM | Admit: 2024-05-23 | Discharge: 2024-05-24 | Disposition: A | Discharge status: Home / Self Care | Attending: Emergency Medicine | Admitting: Emergency Medicine

## 2024-05-23 DIAGNOSIS — K219 Gastro-esophageal reflux disease without esophagitis: Secondary | ICD-10-CM | POA: Insufficient documentation

## 2024-05-23 DIAGNOSIS — K819 Cholecystitis, unspecified: Secondary | ICD-10-CM

## 2024-05-23 DIAGNOSIS — Z79899 Other long term (current) drug therapy: Secondary | ICD-10-CM | POA: Insufficient documentation

## 2024-05-23 DIAGNOSIS — K8012 Calculus of gallbladder with acute and chronic cholecystitis without obstruction: Secondary | ICD-10-CM | POA: Insufficient documentation

## 2024-05-23 LAB — COMPREHENSIVE METABOLIC PANEL WITH GFR
ALT: 21 U/L (ref 0–44)
AST: 18 U/L (ref 15–41)
Albumin: 3.7 g/dL (ref 3.5–5.0)
Alkaline Phosphatase: 95 U/L (ref 38–126)
Anion gap: 11 (ref 5–15)
BUN: 12 mg/dL (ref 6–20)
CO2: 21 mmol/L — ABNORMAL LOW (ref 22–32)
Calcium: 9.3 mg/dL (ref 8.9–10.3)
Chloride: 109 mmol/L (ref 98–111)
Creatinine, Ser: 1.07 mg/dL — ABNORMAL HIGH (ref 0.44–1.00)
GFR, Estimated: >60 mL/min (ref >60)
Glucose, Bld: 118 mg/dL — ABNORMAL HIGH (ref 70–99)
Potassium: 4.3 mmol/L (ref 3.5–5.1)
Sodium: 141 mmol/L (ref 135–145)
Total Bilirubin: 0.4 mg/dL (ref 0.0–1.2)
Total Protein: 6.8 g/dL (ref 6.5–8.1)

## 2024-05-23 LAB — CBC
HCT: 41.1 % (ref 36.0–46.0)
Hemoglobin: 13.9 g/dL (ref 12.0–15.0)
MCH: 30.5 pg (ref 26.0–34.0)
MCHC: 33.8 g/dL (ref 30.0–36.0)
MCV: 90.1 fL (ref 80.0–100.0)
Platelets: 406 K/uL — ABNORMAL HIGH (ref 150–400)
RBC: 4.56 MIL/uL (ref 3.87–5.11)
RDW: 13 % (ref 11.5–15.5)
WBC: 11.7 K/uL — ABNORMAL HIGH (ref 4.0–10.5)
nRBC: 0 % (ref 0.0–0.2)

## 2024-05-23 LAB — LIPASE, BLOOD: Lipase: 41 U/L (ref 11–51)

## 2024-05-23 LAB — URINALYSIS, ROUTINE W REFLEX MICROSCOPIC
Bilirubin Urine: NEGATIVE
Glucose, UA: NEGATIVE mg/dL
Hgb urine dipstick: NEGATIVE
Leukocytes,Ua: NEGATIVE
Nitrite: NEGATIVE
Protein, ur: NEGATIVE mg/dL
Specific Gravity, Urine: 1.026 (ref 1.005–1.030)
pH: 6.5 (ref 5.0–8.0)

## 2024-05-23 LAB — PREGNANCY, URINE: Preg Test, Ur: NEGATIVE

## 2024-05-23 MED ORDER — ALUM & MAG HYDROXIDE-SIMETH 200-200-20 MG/5ML PO SUSP
30.0000 mL | Freq: Once | ORAL | Status: AC
  Administered 2024-05-23: 19:00:00 30 mL via ORAL
  Filled 2024-05-23: qty 30

## 2024-05-23 MED ORDER — ONDANSETRON HCL 4 MG/2ML IJ SOLN
4.0000 mg | Freq: Once | INTRAMUSCULAR | Status: AC
  Administered 2024-05-23: 18:00:00 4 mg via INTRAVENOUS
  Filled 2024-05-23: qty 2

## 2024-05-23 MED ORDER — MORPHINE SULFATE (PF) 4 MG/ML IV SOLN
4.0000 mg | Freq: Once | INTRAVENOUS | Status: AC
  Administered 2024-05-23: 18:00:00 4 mg via INTRAVENOUS
  Filled 2024-05-23: qty 1

## 2024-05-23 MED ORDER — MORPHINE SULFATE (PF) 4 MG/ML IV SOLN
4.0000 mg | INTRAVENOUS | Status: DC | PRN
  Administered 2024-05-23 – 2024-05-24 (×4): 4 mg via INTRAVENOUS
  Filled 2024-05-23 (×4): qty 1

## 2024-05-23 MED ORDER — MORPHINE SULFATE (PF) 4 MG/ML IV SOLN
4.0000 mg | Freq: Once | INTRAVENOUS | Status: AC
  Administered 2024-05-23: 20:00:00 4 mg via INTRAVENOUS
  Filled 2024-05-23: qty 1

## 2024-05-23 MED ORDER — SODIUM CHLORIDE 0.9 % IV SOLN
2.0000 g | Freq: Once | INTRAVENOUS | Status: AC
  Administered 2024-05-23: 20:00:00 2 g via INTRAVENOUS
  Filled 2024-05-23: qty 20

## 2024-05-23 MED ORDER — HYOSCYAMINE SULFATE 0.125 MG SL SUBL
0.2500 mg | SUBLINGUAL_TABLET | Freq: Once | SUBLINGUAL | Status: AC
  Administered 2024-05-23: 19:00:00 0.25 mg via SUBLINGUAL
  Filled 2024-05-23: qty 2

## 2024-05-23 MED ORDER — SODIUM CHLORIDE 0.9 % IV BOLUS
1000.0000 mL | Freq: Once | INTRAVENOUS | Status: AC
  Administered 2024-05-23: 18:00:00 1000 mL via INTRAVENOUS

## 2024-05-23 MED ORDER — IOHEXOL 300 MG/ML  SOLN
100.0000 mL | Freq: Once | INTRAMUSCULAR | Status: AC | PRN
  Administered 2024-05-23: 19:00:00 100 mL via INTRAVENOUS

## 2024-05-23 NOTE — Discharge Instructions (Addendum)
 Jamie Simmons

## 2024-05-23 NOTE — ED Triage Notes (Signed)
 Patient reports epigastric abdominal pain for about 3 days. She said it is progressing and getting more painful. Reports nausea. Denies vomiting or diarrhea. Patient denies any abdominal hx

## 2024-05-23 NOTE — ED Provider Notes (Signed)
 [place] [facility] DE[facility]MENT AT [facility] Provider Note   CSN: [id number] Arrival date & time: 05/23/24  1646     Patient presents with: Abdominal Pain   Jamie Simmons is a 33 y.o. female with past medical history of dysmenorrhea, daily headache, ADHD presents to emergency room with complaint of abdominal pain.  Patient reports that she has had severe epigastric pain for 3 days.  She reports she has nausea she has tried Tylenol , omeprazole and bland foods at home without significant improvement.  She denies any drug or alcohol use nor excessive NSAID use.  No blood in stool. No fever, dysuria, CP, SOB, cough, vomiting, diarrhea.     Abdominal Pain      Prior to Admission medications  Medication Sig Start Date End Date Taking? Authorizing Provider  acetaminophen  (TYLENOL ) 500 MG tablet Take 1,000 mg by mouth every 6 (six) hours as needed for moderate pain.    [provider]  citalopram  (CELEXA ) 20 MG tablet Take 20 mg by mouth daily.    [provider]  iron  polysaccharides (NIFEREX) 150 MG capsule Take 1 capsule (150 mg total) by mouth daily. 07/27/22   Meisinger, Krystal, MD  omeprazole (PRILOSEC) 20 MG capsule Take 20 mg by mouth daily. Patient not taking: Reported on 07/22/2022    [provider]  ondansetron  (ZOFRAN ) 4 MG tablet Take 1 tablet (4 mg total) by mouth every 8 (eight) hours as needed for nausea or vomiting. 07/26/22   Meisinger, Krystal, MD  oxyCODONE  (OXY IR/ROXICODONE ) 5 MG immediate release tablet Take 1 tablet (5 mg total) by mouth every 6 (six) hours as needed for severe pain. 07/26/22   Meisinger, Krystal, MD    Allergies: Patient has no known allergies.    Review of Systems  Gastrointestinal:  Positive for abdominal pain.    Updated Vital Signs BP 116/79   Pulse 86   Temp 98.7 F (37.1 C) (Oral)   Resp 15   SpO2 100%   Physical Exam Vitals and nursing note reviewed.  Constitutional:      General: She is not in  acute distress.    Appearance: She is not toxic-appearing.  HENT:     Head: Normocephalic and atraumatic.  Eyes:     General: No scleral icterus.    Conjunctiva/sclera: Conjunctivae normal.  Cardiovascular:     Rate and Rhythm: Normal rate and regular rhythm.     Pulses: Normal pulses.     Heart sounds: Normal heart sounds.  Pulmonary:     Effort: Pulmonary effort is normal. No respiratory distress.     Breath sounds: Normal breath sounds.  Abdominal:     General: Abdomen is flat. Bowel sounds are normal.     Palpations: Abdomen is soft.     Tenderness: There is abdominal tenderness.     Comments: Epigastric TTP, no rebound.   Skin:    General: Skin is warm and dry.     Findings: No lesion.  Neurological:     General: No focal deficit present.     Mental Status: She is alert and oriented to person, place, and time. Mental status is at baseline.     (all labs ordered are listed, but only abnormal results are displayed) Labs Reviewed  COMPREHENSIVE METABOLIC PANEL WITH GFR - Abnormal; Notable for the following components:      Result Value   CO2 21 (*)    Glucose, Bld 118 (*)    Creatinine, Ser 1.07 (*)  All other components within normal limits  CBC - Abnormal; Notable for the following components:   WBC 11.7 (*)    Platelets 406 (*)    All other components within normal limits  URINALYSIS, ROUTINE W REFLEX MICROSCOPIC - Abnormal; Notable for the following components:   Ketones, ur TRACE (*)    All other components within normal limits  LIPASE, BLOOD  PREGNANCY, URINE    EKG: None  Radiology: CT ABDOMEN PELVIS W CONTRAST Result Date: 05/23/2024 EXAM: CT ABDOMEN AND PELVIS WITH CONTRAST 05/23/2024 06:55:59 PM TECHNIQUE: CT of the abdomen and pelvis was performed with the administration of 100 mL of iohexol  (OMNIPAQUE ) 300 MG/ML solution. Multiplanar reformatted images are provided for review. Automated exposure control, iterative reconstruction, and/or  weight-based adjustment of the mA/kV was utilized to reduce the radiation dose to as low as reasonably achievable. COMPARISON: Comparison with 03/01/2012. CLINICAL HISTORY: Abdominal pain, acute, nonlocalized. FINDINGS: LOWER CHEST: Mild dependent atelectasis in the lung bases. LIVER: The liver is unremarkable. GALLBLADDER AND BILE DUCTS: Cholelithiasis with noncalcified gas-filled stones in the gallbladder. Gallbladder wall thickening and pericholecystic edema. Changes are consistent with acute cholecystitis in the appropriate clinical setting. No bile duct dilatation. SPLEEN: No acute abnormality. PANCREAS: No acute abnormality. ADRENAL GLANDS: No acute abnormality. KIDNEYS, URETERS AND BLADDER: No stones in the kidneys or ureters. No hydronephrosis. No perinephric or periureteral stranding. Urinary bladder is unremarkable. GI AND BOWEL: Stomach demonstrates no acute abnormality. There is no bowel obstruction. Appendix is normal. PERITONEUM AND RETROPERITONEUM: No ascites. No free air. VASCULATURE: Aorta is normal in caliber. LYMPH NODES: No lymphadenopathy. REPRODUCTIVE ORGANS: No acute abnormality. BONES AND SOFT TISSUES: No acute osseous abnormality. No focal soft tissue abnormality. IMPRESSION: 1. Cholelithiasis with gallbladder wall thickening and pericholecystic edema. Findings consistent with acute cholecystitis in the appropriate clinical setting . Electronically signed by: Elsie Gravely MD 05/23/2024 07:08 PM EST RP Workstation: [id number]     Procedures   Medications Ordered in the ED  sodium chloride  0.9 % bolus 1,000 mL (0 mLs Intravenous Stopped 05/23/24 1848)  morphine  (PF) 4 MG/ML injection 4 mg (4 mg Intravenous Given 05/23/24 1805)  ondansetron  (ZOFRAN ) injection 4 mg (4 mg Intravenous Given 05/23/24 1804)  iohexol  (OMNIPAQUE ) 300 MG/ML solution 100 mL (100 mLs Intravenous Contrast Given 05/23/24 1850)  alum & mag hydroxide-simeth (MAALOX/MYLANTA) 200-200-20 MG/5ML suspension 30 mL  (30 mLs Oral Given 05/23/24 1905)  hyoscyamine  (LEVSIN  SL) SL tablet 0.25 mg (0.25 mg Sublingual Given 05/23/24 1906)  cefTRIAXone  (ROCEPHIN ) 2 g in sodium chloride  0.9 % 100 mL IVPB (0 g Intravenous Stopped 05/23/24 2005)  morphine  (PF) 4 MG/ML injection 4 mg (4 mg Intravenous Given 05/23/24 1936)    Clinical Course as of 05/23/24 2120  Wed May 23, 2024  1938 Patient was reassessed and still continues to have significant right upper quadrant epigastric pain on exam.  Her presentation is concerning for acute cholecystitis.  Given 1 of Rocephin  and will discuss with general surgery. [JB]  [id number] Discussed with Dr. Rubin however he is in trauma and will have to be tracked. [JB]    Clinical Course User Index [JB] Mildred Tuccillo, Warren SAILOR, PA-C                                 Medical Decision Making Amount and/or Complexity of Data Reviewed Labs: ordered. Radiology: ordered.  Risk OTC drugs. Prescription drug management. Decision regarding hospitalization.   This patient presents  to the ED for concern of abdominal pain, this involves an extensive number of treatment options, and is a complaint that carries with it a high risk of complications and morbidity.  The differential diagnosis includes cholecystitis, AAA, appendicitis, renal stone, UTI   Co morbidities that complicate the patient evaluation  Daily headaches   Additional history obtained:  Additional history obtained from was seen 05/14/2024 for flank pain and started on Cipro for suspected pyelonephritis.    Lab Tests:  I personally interpreted labs.  The pertinent results include:   CBC with mild leukocytosis, no anemia. CMP unremarkable, lipase unremarkable.  UA negative , Upreg negative    Imaging Studies ordered:  I ordered imaging studies including CT abd/pelvis   I independently visualized and interpreted imaging which showed cholelithiasis with gallbladder wall thickening and pericholecystic edema consistent with  acute cholecystitis.  I agree with the radiologist interpretation    Problem List / ED Course / Critical interventions / Medication management  Patient presents to emergency room with complaint of epigastric pain that has been ongoing for 3 days.  This seemed to have started after taking Cipro and antibiotic for pyelonephritis.  She reports some nausea but she has not had any other significant symptoms nor fever.  She tried over-the-counter medicines without any significant improvement.  She does have some mild leukocytosis on her CBC.  Creatinine is 1.07 and otherwise CMP is unremarkable.  Lipase pregnancy test and urine are within normal limits.  She was treated here with normal saline Zofran  and morphine  followed by [facility]. Given severity of symptoms will obtain CT scan to rule out any acute findings of abdomen and pelvis. I ordered medication including Morphine , zofran , NS Reevaluation of the patient after these medicines showed that the patient improved I have reviewed the patients home medicines and have made adjustments as needed. CT scan shows acute cholecystitis.  I discussed with Dr. Rubin.  Plan is to take her to the OR tomorrow morning at [facility].      Final diagnoses:  Cholecystitis    ED Discharge Orders     None          Shermon Warren LOISE DEVONNA 05/23/24 2121    Charlyn Sora, MD 05/23/24 2141

## 2024-05-23 NOTE — ED Notes (Signed)
 Pt currently unable to provide the sample... the patient is aware of the need.

## 2024-05-24 ENCOUNTER — Emergency Department (HOSPITAL_COMMUNITY): Admitting: Anesthesiology

## 2024-05-24 ENCOUNTER — Encounter (HOSPITAL_COMMUNITY): Payer: Self-pay | Admitting: Urology

## 2024-05-24 ENCOUNTER — Encounter (HOSPITAL_COMMUNITY)
Admission: EM | Disposition: A | Discharge status: Home / Self Care | Payer: Self-pay | Source: Home / Self Care | Attending: Emergency Medicine

## 2024-05-24 ENCOUNTER — Other Ambulatory Visit (HOSPITAL_COMMUNITY): Payer: Self-pay

## 2024-05-24 HISTORY — PX: CHOLECYSTECTOMY: SHX55

## 2024-05-24 SURGERY — LAPAROSCOPIC CHOLECYSTECTOMY
Anesthesia: General | Site: Abdomen

## 2024-05-24 MED ORDER — SCOPOLAMINE 1 MG/3DAYS TD PT72
MEDICATED_PATCH | TRANSDERMAL | Status: AC
  Filled 2024-05-24: qty 1

## 2024-05-24 MED ORDER — FENTANYL CITRATE (PF) 250 MCG/5ML IJ SOLN
INTRAMUSCULAR | Status: AC
  Filled 2024-05-24: qty 5

## 2024-05-24 MED ORDER — FENTANYL CITRATE (PF) 100 MCG/2ML IJ SOLN
INTRAMUSCULAR | Status: AC
  Filled 2024-05-24: qty 2

## 2024-05-24 MED ORDER — MIDAZOLAM HCL (PF) 2 MG/2ML IJ SOLN
INTRAMUSCULAR | Status: DC | PRN
  Administered 2024-05-24: 13:00:00 2 mg via INTRAVENOUS

## 2024-05-24 MED ORDER — DEXAMETHASONE SOD PHOSPHATE PF 10 MG/ML IJ SOLN
INTRAMUSCULAR | Status: DC | PRN
  Administered 2024-05-24: 13:00:00 10 mg via INTRAVENOUS

## 2024-05-24 MED ORDER — ONDANSETRON HCL 4 MG/2ML IJ SOLN
INTRAMUSCULAR | Status: AC
  Filled 2024-05-24: qty 2

## 2024-05-24 MED ORDER — LIDOCAINE 2% (20 MG/ML) 5 ML SYRINGE
INTRAMUSCULAR | Status: AC
  Filled 2024-05-24: qty 5

## 2024-05-24 MED ORDER — PROPOFOL 1000 MG/100ML IV EMUL
INTRAVENOUS | Status: AC
  Filled 2024-05-24: qty 100

## 2024-05-24 MED ORDER — DROPERIDOL 2.5 MG/ML IJ SOLN: 0.6250 mg | Freq: Once | INTRAMUSCULAR | Status: DC | PRN

## 2024-05-24 MED ORDER — BUPIVACAINE-EPINEPHRINE 0.25% -1:200000 IJ SOLN
INTRAMUSCULAR | Status: DC | PRN
  Administered 2024-05-24: 13:00:00 9 mL

## 2024-05-24 MED ORDER — ROCURONIUM BROMIDE 10 MG/ML (PF) SYRINGE
PREFILLED_SYRINGE | INTRAVENOUS | Status: DC | PRN
  Administered 2024-05-24: 13:00:00 10 mg via INTRAVENOUS
  Administered 2024-05-24: 13:00:00 50 mg via INTRAVENOUS

## 2024-05-24 MED ORDER — ONDANSETRON HCL 4 MG/2ML IJ SOLN
4.0000 mg | Freq: Once | INTRAMUSCULAR | Status: AC
  Administered 2024-05-24: 10:00:00 4 mg via INTRAVENOUS
  Filled 2024-05-24: qty 2

## 2024-05-24 MED ORDER — OXYCODONE HCL 5 MG/5ML PO SOLN
ORAL | Status: AC
  Filled 2024-05-24: qty 5

## 2024-05-24 MED ORDER — ONDANSETRON HCL 4 MG/2ML IJ SOLN
INTRAMUSCULAR | Status: DC | PRN
  Administered 2024-05-24: 14:00:00 4 mg via INTRAVENOUS

## 2024-05-24 MED ORDER — ROCURONIUM BROMIDE 10 MG/ML (PF) SYRINGE
PREFILLED_SYRINGE | INTRAVENOUS | Status: AC
  Filled 2024-05-24: qty 10

## 2024-05-24 MED ORDER — LACTATED RINGERS IV SOLN: INTRAVENOUS | Status: DC

## 2024-05-24 MED ORDER — SUCCINYLCHOLINE CHLORIDE 200 MG/10ML IV SOSY
PREFILLED_SYRINGE | INTRAVENOUS | Status: DC | PRN
  Administered 2024-05-24: 13:00:00 140 mg via INTRAVENOUS

## 2024-05-24 MED ORDER — CHLORHEXIDINE GLUCONATE 0.12 % MT SOLN
15.0000 mL | Freq: Once | OROMUCOSAL | Status: AC
  Administered 2024-05-24: 11:00:00 15 mL via OROMUCOSAL

## 2024-05-24 MED ORDER — SODIUM CHLORIDE 0.9 % IV SOLN
2.0000 g | Freq: Once | INTRAVENOUS | Status: AC
  Administered 2024-05-24: 13:00:00 2 g via INTRAVENOUS

## 2024-05-24 MED ORDER — SODIUM CHLORIDE 0.9 % IV SOLN
INTRAVENOUS | Status: AC
  Filled 2024-05-24: qty 20

## 2024-05-24 MED ORDER — FENTANYL CITRATE (PF) 100 MCG/2ML IJ SOLN
25.0000 ug | INTRAMUSCULAR | Status: DC | PRN
  Administered 2024-05-24 (×3): 50 ug via INTRAVENOUS

## 2024-05-24 MED ORDER — TRAMADOL HCL 50 MG PO TABS
50.0000 mg | ORAL_TABLET | Freq: Four times a day (QID) | ORAL | 0 refills | Status: AC | PRN
  Filled 2024-05-24: qty 15, 4d supply, fill #0

## 2024-05-24 MED ORDER — ONDANSETRON HCL 4 MG PO TABS
4.0000 mg | ORAL_TABLET | Freq: Four times a day (QID) | ORAL | 0 refills | Status: AC
  Filled 2024-05-24: qty 12, 3d supply, fill #0

## 2024-05-24 MED ORDER — SUCCINYLCHOLINE CHLORIDE 200 MG/10ML IV SOSY
PREFILLED_SYRINGE | INTRAVENOUS | Status: AC
  Filled 2024-05-24: qty 10

## 2024-05-24 MED ORDER — PROPOFOL 10 MG/ML IV BOLUS
INTRAVENOUS | Status: DC | PRN
  Administered 2024-05-24: 13:00:00 170 mg via INTRAVENOUS
  Administered 2024-05-24: 13:00:00 125 ug/kg/min via INTRAVENOUS

## 2024-05-24 MED ORDER — SUGAMMADEX SODIUM 200 MG/2ML IV SOLN
INTRAVENOUS | Status: AC
  Filled 2024-05-24: qty 2

## 2024-05-24 MED ORDER — MIDAZOLAM HCL 2 MG/2ML IJ SOLN
INTRAMUSCULAR | Status: AC
  Filled 2024-05-24: qty 2

## 2024-05-24 MED ORDER — EPHEDRINE 5 MG/ML INJ
INTRAVENOUS | Status: AC
  Filled 2024-05-24: qty 5

## 2024-05-24 MED ORDER — AMISULPRIDE (ANTIEMETIC) 5 MG/2ML IV SOLN
10.0000 mg | Freq: Once | INTRAVENOUS | Status: AC
  Administered 2024-05-24: 12:00:00 10 mg via INTRAVENOUS
  Filled 2024-05-24: qty 4

## 2024-05-24 MED ORDER — ACETAMINOPHEN 10 MG/ML IV SOLN
INTRAVENOUS | Status: DC | PRN
  Administered 2024-05-24: 14:00:00 1000 mg via INTRAVENOUS

## 2024-05-24 MED ORDER — OXYCODONE HCL 5 MG/5ML PO SOLN
5.0000 mg | Freq: Once | ORAL | Status: AC
  Administered 2024-05-24: 16:00:00 5 mg via ORAL

## 2024-05-24 MED ORDER — BUPIVACAINE-EPINEPHRINE (PF) 0.25% -1:200000 IJ SOLN
INTRAMUSCULAR | Status: AC
  Filled 2024-05-24: qty 30

## 2024-05-24 MED ORDER — LIDOCAINE 2% (20 MG/ML) 5 ML SYRINGE
INTRAMUSCULAR | Status: DC | PRN
  Administered 2024-05-24: 13:00:00 60 mg via INTRAVENOUS

## 2024-05-24 MED ORDER — SODIUM CHLORIDE 0.9 % IV BOLUS: 1000.0000 mL | Freq: Once | INTRAVENOUS | Status: DC

## 2024-05-24 MED ORDER — ORAL CARE MOUTH RINSE: 15.0000 mL | Freq: Once | OROMUCOSAL | Status: AC

## 2024-05-24 MED ORDER — SCOPOLAMINE 1 MG/3DAYS TD PT72
1.0000 | MEDICATED_PATCH | TRANSDERMAL | Status: DC
  Administered 2024-05-24: 11:00:00 1 mg via TRANSDERMAL

## 2024-05-24 MED ORDER — INDOCYANINE GREEN 25 MG IJ SOLR
1.2500 mg | Freq: Once | INTRAMUSCULAR | Status: AC
  Administered 2024-05-24: 12:00:00 1.25 mg via INTRAVENOUS

## 2024-05-24 MED ORDER — SUGAMMADEX SODIUM 200 MG/2ML IV SOLN
INTRAVENOUS | Status: DC | PRN
  Administered 2024-05-24: 14:00:00 200 mg via INTRAVENOUS

## 2024-05-24 MED ORDER — ACETAMINOPHEN 10 MG/ML IV SOLN
INTRAVENOUS | Status: AC
  Filled 2024-05-24: qty 100

## 2024-05-24 MED ORDER — HYDROMORPHONE HCL 1 MG/ML IJ SOLN
1.0000 mg | Freq: Once | INTRAMUSCULAR | Status: AC
  Administered 2024-05-24: 10:00:00 1 mg via INTRAVENOUS
  Filled 2024-05-24: qty 1

## 2024-05-24 MED ORDER — FENTANYL CITRATE (PF) 250 MCG/5ML IJ SOLN
INTRAMUSCULAR | Status: DC | PRN
  Administered 2024-05-24: 13:00:00 50 ug via INTRAVENOUS
  Administered 2024-05-24: 13:00:00 100 ug via INTRAVENOUS

## 2024-05-24 MED ORDER — DEXMEDETOMIDINE HCL IN NACL 80 MCG/20ML IV SOLN
INTRAVENOUS | Status: DC | PRN
  Administered 2024-05-24 (×2): 8 ug via INTRAVENOUS

## 2024-05-24 MED FILL — Amisulpride (Antiemetic) IV Soln 5 MG/2ML: INTRAVENOUS | Qty: 2 | Status: AC

## 2024-05-24 SURGICAL SUPPLY — 35 items
BAG COUNTER SPONGE SURGICOUNT (BAG) ×1 IMPLANT
BLADE CLIPPER SURG (BLADE) IMPLANT
CANISTER SUCTION 3000ML PPV (SUCTIONS) ×1 IMPLANT
CHLORAPREP W/TINT 26 (MISCELLANEOUS) ×1 IMPLANT
CLIP APPLIE 5 13 M/L LIGAMAX5 (MISCELLANEOUS) ×1 IMPLANT
COVER SURGICAL LIGHT HANDLE (MISCELLANEOUS) ×1 IMPLANT
DERMABOND ADVANCED .7 DNX12 (GAUZE/BANDAGES/DRESSINGS) ×1 IMPLANT
ELECTRODE L-HOOK LAP 45CM DISP (ELECTROSURGICAL) IMPLANT
ELECTRODE REM PT RTRN 9FT ADLT (ELECTROSURGICAL) ×1 IMPLANT
GLOVE BIO SURGEON STRL SZ7 (GLOVE) ×1 IMPLANT
GLOVE BIOGEL PI IND STRL 7.5 (GLOVE) ×1 IMPLANT
GOWN STRL REUS W/ TWL LRG LVL3 (GOWN DISPOSABLE) ×3 IMPLANT
GRASPER SUT TROCAR 14GX15 (MISCELLANEOUS) ×1 IMPLANT
HEMOSTAT SNOW SURGICEL 2X4 (HEMOSTASIS) IMPLANT
IRRIGATION SUCT STRKRFLW 2 WTP (MISCELLANEOUS) ×1 IMPLANT
KIT BASIN OR (CUSTOM PROCEDURE TRAY) ×1 IMPLANT
KIT IMAGING PINPOINTPAQ (MISCELLANEOUS) IMPLANT
KIT TURNOVER KIT B (KITS) ×1 IMPLANT
PAD ARMBOARD POSITIONER FOAM (MISCELLANEOUS) ×1 IMPLANT
PENCIL BUTTON HOLSTER BLD 10FT (ELECTRODE) IMPLANT
POUCH RETRIEVAL ECOSAC 10 (ENDOMECHANICALS) ×1 IMPLANT
SCISSORS LAP 5X35 DISP (ENDOMECHANICALS) ×1 IMPLANT
SET TUBE SMOKE EVAC HIGH FLOW (TUBING) ×1 IMPLANT
SLEEVE Z-THREAD 5X100MM (TROCAR) ×2 IMPLANT
SOLN 0.9% NACL POUR BTL 1000ML (IV SOLUTION) ×1 IMPLANT
SOLN STERILE WATER BTL 1000 ML (IV SOLUTION) ×1 IMPLANT
STRIP CLOSURE SKIN 1/2X4 (GAUZE/BANDAGES/DRESSINGS) ×1 IMPLANT
SUT MNCRL AB 4-0 PS2 18 (SUTURE) ×1 IMPLANT
SUT VICRYL 0 UR6 27IN ABS (SUTURE) ×1 IMPLANT
TOWEL GREEN STERILE (TOWEL DISPOSABLE) ×1 IMPLANT
TOWEL GREEN STERILE FF (TOWEL DISPOSABLE) ×1 IMPLANT
TRAY LAPAROSCOPIC MC (CUSTOM PROCEDURE TRAY) ×1 IMPLANT
TROCAR BALLN 12MMX100 BLUNT (TROCAR) ×1 IMPLANT
TROCAR Z-THREAD OPTICAL 5X100M (TROCAR) ×1 IMPLANT
WARMER LAPAROSCOPE (MISCELLANEOUS) ×1 IMPLANT

## 2024-05-24 NOTE — Anesthesia Preprocedure Evaluation (Addendum)
 Anesthesia Evaluation  Patient identified by MRN, date of birth, ID band Patient awake    Reviewed: Allergy & Precautions, NPO status , Patient's Chart, lab work & pertinent test results  Airway Mallampati: II  TM Distance: >3 FB Neck ROM: Full    Dental no notable dental hx.    Pulmonary neg pulmonary ROS   Pulmonary exam normal        Cardiovascular  Rhythm:Regular Rate:Normal     Neuro/Psych  Headaches  negative psych ROS   GI/Hepatic Neg liver ROS,GERD  Medicated,,Cholecystitis    Endo/Other  negative endocrine ROS    Renal/GU   negative genitourinary   Musculoskeletal negative musculoskeletal ROS (+)    Abdominal Normal abdominal exam  (+)   Peds  Hematology Lab Results      Component                Value               Date                      WBC                      11.7 (H)            05/23/2024                HGB                      13.9                05/23/2024                HCT                      41.1                05/23/2024                MCV                      90.1                05/23/2024                PLT                      406 (H)             05/23/2024              Anesthesia Other Findings   Reproductive/Obstetrics                              Anesthesia Physical Anesthesia Plan  ASA: 2  Anesthesia Plan: General   Post-op Pain Management: Ofirmev  IV (intra-op)*   Induction: Intravenous  PONV Risk Score and Plan: 3 and Ondansetron , Dexamethasone , Midazolam  and Treatment may vary due to age or medical condition  Airway Management Planned: Mask and Oral ETT  Additional Equipment: None  Intra-op Plan:   Post-operative Plan: Extubation in OR  Informed Consent: I have reviewed the patients History and Physical, chart, labs and discussed the procedure including the risks, benefits and alternatives for the proposed anesthesia with the patient or  authorized representative who has indicated his/her understanding and acceptance.  Dental advisory given  Plan Discussed with: CRNA  Anesthesia Plan Comments:          Anesthesia Quick Evaluation

## 2024-05-24 NOTE — ED Notes (Signed)
 [facility] informed that pt is going POV

## 2024-05-24 NOTE — Anesthesia Procedure Notes (Signed)
 Procedure Name: Intubation Date/Time: 05/24/2024 12:58 PM  Performed by: Emmitt Millman, CRNAPre-anesthesia Checklist: Patient identified, Emergency Drugs available, Suction available and Patient being monitored Patient Re-evaluated:Patient Re-evaluated prior to induction Oxygen Delivery Method: Circle system utilized Preoxygenation: Pre-oxygenation with 100% oxygen Induction Type: IV induction, Rapid sequence and Cricoid Pressure applied Laryngoscope Size: Mac and 4 Grade View: Grade I Tube type: Oral Tube size: 7.0 mm Number of attempts: 1 Airway Equipment and Method: Stylet Placement Confirmation: ETT inserted through vocal cords under direct vision, positive ETCO2 and breath sounds checked- equal and bilateral Secured at: 21 cm Tube secured with: Tape Dental Injury: Teeth and Oropharynx as per pre-operative assessment

## 2024-05-24 NOTE — H&P (Signed)
 H&P Note  Jamie Simmons 1990-08-20  [id number].    Requesting MD: Warren Shad, PA-C Chief Complaint/Reason for Consult: Acute cholecystitis  HPI:  Patient is a 33 year old female who presented to [facility] with abdominal pain for 3 days. Pain is epigastric and severe. Associated nausea, vomiting and subjective fevers/chills. She does not think she has had similar symptoms previously. PMH otherwise significant for ADHD, headaches, dysmenorrhea. No prior abdominal surgery. Not on blood thinners. Notes itching with cipro recently. Denies alcohol, tobacco or illicit drug use. Mother is at bedside.   ROS: Negative other than HPI  Family History  Problem Relation Age of Onset   Drug abuse Mother    Migraines Mother    Hypertension Mother    Diabetes Father    Obesity Father     Past Medical History:  Diagnosis Date   Attention deficit disorder    Migraine    Nephrolithiasis 02/06/2012    Past Surgical History:  Procedure Laterality Date   WISDOM TOOTH EXTRACTION      Social History:  reports that she has never smoked. She has never used smokeless tobacco. She reports that she does not drink alcohol and does not use drugs.  Allergies: Allergies[1]  Medications Prior to Admission  Medication Sig Dispense Refill   calcium  carbonate (TUMS - DOSED IN MG ELEMENTAL CALCIUM ) 500 MG chewable tablet Chew 1 tablet by mouth 3 (three) times daily as needed for indigestion or heartburn.     ciprofloxacin (CIPRO) 500 MG tablet Take 500 mg by mouth 2 (two) times daily.     ondansetron  (ZOFRAN ) 4 MG tablet Take 1 tablet (4 mg total) by mouth every 8 (eight) hours as needed for nausea or vomiting. 20 tablet 0   acetaminophen  (TYLENOL ) 500 MG tablet Take 1,000 mg by mouth every 6 (six) hours as needed for moderate pain.     citalopram  (CELEXA ) 20 MG tablet Take 20 mg by mouth daily.     iron  polysaccharides (NIFEREX) 150 MG capsule Take 1 capsule (150 mg total) by mouth daily. 30  capsule 3   omeprazole (PRILOSEC) 20 MG capsule Take 20 mg by mouth daily.     oxyCODONE  (OXY IR/ROXICODONE ) 5 MG immediate release tablet Take 1 tablet (5 mg total) by mouth every 6 (six) hours as needed for severe pain. 10 tablet 0    Blood pressure 121/83, pulse 73, temperature 97.6 F (36.4 C), temperature source Oral, resp. rate 17, height 5' 6 (1.676 m), weight 99.8 kg, last menstrual period 05/14/2024, SpO2 99%, not currently breastfeeding. Physical Exam:  General: pleasant, WD, overweight female who is laying in bed in NAD HEENT: head is normocephalic, atraumatic.  Sclera are anicteric. PERRL.  Ears and nose without any masses or lesions.  Mouth is pink and moist Heart: regular, rate, and rhythm. Palpable radial and pedal pulses bilaterally Lungs: No wheezes, rhonchi, or rales noted.  Respiratory effort nonlabored Abd: soft, mild epigastric ttp, ND, no masses, hernias, or organomegaly MS: all 4 extremities are symmetrical with no cyanosis, clubbing, or edema. Skin: warm and dry with no masses, lesions, or rashes Neuro: Cranial nerves 2-12 grossly intact, sensation is normal throughout Psych: A&Ox3 with an appropriate affect.   Results for orders placed or performed during the hospital encounter of 05/23/24 (from the past 48 hours)  Lipase, blood     Status: None   Collection Time: 05/23/24  4:54 PM  Result Value Ref Range   Lipase 41 11 -  51 U/L    Comment: Performed at [facility], [address], [place], [place] [address]  Comprehensive metabolic panel     Status: Abnormal   Collection Time: 05/23/24  4:54 PM  Result Value Ref Range   Sodium 141 135 - 145 mmol/L   Potassium 4.3 3.5 - 5.1 mmol/L   Chloride 109 98 - 111 mmol/L   CO2 21 (L) 22 - 32 mmol/L   Glucose, Bld 118 (H) 70 - 99 mg/dL    Comment: Glucose reference range applies only to samples taken after fasting for at least 8 hours.   BUN 12 6 - 20 mg/dL   Creatinine, Ser 8.92 (H) 0.44 - 1.00  mg/dL   Calcium  9.3 8.9 - 10.3 mg/dL   Total Protein 6.8 6.5 - 8.1 g/dL   Albumin 3.7 3.5 - 5.0 g/dL   AST 18 15 - 41 U/L   ALT 21 0 - 44 U/L   Alkaline Phosphatase 95 38 - 126 U/L   Total Bilirubin 0.4 0.0 - 1.2 mg/dL   GFR, Estimated >39 >39 mL/min    Comment: (NOTE) Calculated using the CKD-EPI Creatinine Equation (2021)    Anion gap 11 5 - 15    Comment: Performed at [facility], [address] [place], [place] [address]  CBC     Status: Abnormal   Collection Time: 05/23/24  4:54 PM  Result Value Ref Range   WBC 11.7 (H) 4.0 - 10.5 K/uL   RBC 4.56 3.87 - 5.11 MIL/uL   Hemoglobin 13.9 12.0 - 15.0 g/dL   HCT 58.8 63.9 - 53.9 %   MCV 90.1 80.0 - 100.0 fL   MCH 30.5 26.0 - 34.0 pg   MCHC 33.8 30.0 - 36.0 g/dL   RDW 86.9 88.4 - 84.4 %   Platelets 406 (H) 150 - 400 K/uL   nRBC 0.0 0.0 - 0.2 %    Comment: Performed at [facility], [address] [place], [place] [address]  Urinalysis, Routine w reflex microscopic -Urine, Clean Catch     Status: Abnormal   Collection Time: 05/23/24  4:54 PM  Result Value Ref Range   Color, Urine YELLOW YELLOW   APPearance CLEAR CLEAR   Specific Gravity, Urine 1.026 1.005 - 1.030   pH 6.5 5.0 - 8.0   Glucose, UA NEGATIVE NEGATIVE mg/dL   Hgb urine dipstick NEGATIVE NEGATIVE   Bilirubin Urine NEGATIVE NEGATIVE   Ketones, ur TRACE (A) NEGATIVE mg/dL   Protein, ur NEGATIVE NEGATIVE mg/dL   Nitrite NEGATIVE NEGATIVE   Leukocytes,Ua NEGATIVE NEGATIVE    Comment: Performed at [facility], [address] [place], [place] [address]  Pregnancy, urine     Status: None   Collection Time: 05/23/24  4:54 PM  Result Value Ref Range   Preg Test, Ur NEGATIVE NEGATIVE    Comment:        THE SENSITIVITY OF THIS METHODOLOGY IS >20 mIU/mL. Performed at [facility], [address] [place], [place] [address]    CT ABDOMEN PELVIS W CONTRAST Result Date: 05/23/2024 EXAM:  CT ABDOMEN AND PELVIS WITH CONTRAST 05/23/2024 06:55:59 PM TECHNIQUE: CT of the abdomen and pelvis was performed with the administration of 100 mL of iohexol  (OMNIPAQUE ) 300 MG/ML solution. Multiplanar reformatted images are provided for review. Automated exposure control, iterative reconstruction, and/or weight-based adjustment of the mA/kV was utilized to reduce the radiation dose to as low as reasonably achievable. COMPARISON: Comparison with 03/01/2012. CLINICAL HISTORY:  Abdominal pain, acute, nonlocalized. FINDINGS: LOWER CHEST: Mild dependent atelectasis in the lung bases. LIVER: The liver is unremarkable. GALLBLADDER AND BILE DUCTS: Cholelithiasis with noncalcified gas-filled stones in the gallbladder. Gallbladder wall thickening and pericholecystic edema. Changes are consistent with acute cholecystitis in the appropriate clinical setting. No bile duct dilatation. SPLEEN: No acute abnormality. PANCREAS: No acute abnormality. ADRENAL GLANDS: No acute abnormality. KIDNEYS, URETERS AND BLADDER: No stones in the kidneys or ureters. No hydronephrosis. No perinephric or periureteral stranding. Urinary bladder is unremarkable. GI AND BOWEL: Stomach demonstrates no acute abnormality. There is no bowel obstruction. Appendix is normal. PERITONEUM AND RETROPERITONEUM: No ascites. No free air. VASCULATURE: Aorta is normal in caliber. LYMPH NODES: No lymphadenopathy. REPRODUCTIVE ORGANS: No acute abnormality. BONES AND SOFT TISSUES: No acute osseous abnormality. No focal soft tissue abnormality. IMPRESSION: 1. Cholelithiasis with gallbladder wall thickening and pericholecystic edema. Findings consistent with acute cholecystitis in the appropriate clinical setting . Electronically signed by: Elsie Gravely MD 05/23/2024 07:08 PM EST RP Workstation: [id number]      Assessment/Plan Acute cholecystitis  - CT AP 12/17 with cholelithiasis, gallbladder wall thickening and pericholecystic edema - WBC 11K, afebrile and HD  stable - persistently symptomatic -  I have explained the procedure, risks, and aftercare of Laparoscopic cholecystectomy.  Risks include but are not limited to anesthesia (MI, CVA, death, prolonged intubation and aspiration), bleeding, infection, wound problems, hernia, bile leak, injury to common bile duct/liver/intestine, possible need for subtotal cholecystectomy or open cholecystectomy, increased risk of DVT/PE and diarrhea post op.  She seems to understand and agrees to proceed. Possible discharge from PACU post-operatively, if not able to be discharged then will admit to observation.  FEN: NPO VTE: SCDs ID: Rocephin     I reviewed ED provider notes, last 24 h vitals and pain scores, last 48 h intake and output, last 24 h labs and trends, and last 24 h imaging results.  This care required high  level of medical decision making.   Burnard JONELLE Louder, [facility] Surgery 05/24/2024, 11:59 AM Please see Amion for pager number during day hours 7:00am-4:30pm      [1]  Allergies Allergen Reactions   Ciprofloxacin Itching

## 2024-05-24 NOTE — Op Note (Signed)
 Preoperative diagnosis: Acute cholecystitis Postoperative diagnosis: Same as above Procedure: Laparoscopic cholecystectomy Surgeon: Dr. Adina Bury Estimated blood loss: 50 cc Specimens: Gallbladder and contents to pathology Anesthesia: General Drains: None Complications none Sponge needle count was correct at completion Dispo recovery stable  Indications: 33 year old female who has RUQ pain with nl LFTs, CT consistent with calculous cholecystitis. Discussed laparoscopic cholecystectomy.  Procedure: After informed consent was obtained she was taken to the operating room.  She was given ICG dye.  She was given antibiotics.  SCDs were in place.  She was placed under general anesthesia without complication.  She was prepped and draped in standard sterile surgical fashion.  Surgical timeout was then performed.  I infiltrated Marcaine  below the umbilicus.  I made a vertical incision.  I incised the fascia sharply and entered the peritoneum bluntly without injury.  I then placed a 0 Vicryl pursestring suture.  I inserted a Hassan trocar and insufflated the abdomen to 15 mmHg pressure.  I then inserted 3 additional 5 mm trocars in the epigastrium and right upper quadrant under direct vision.  Her gallbladder was noted to have acute cholecystitis.  I had to aspirate it just to grasp it.  It was retracted cephalad and lateral.  With some difficulty I was able to dissect in the triangle.  I used the ICG dye to identify my cystic duct as well as the common duct in its entirety.  Eventually I was able to obtain a critical view of safety and to take in the gallbladder off the liver bed I clipped the artery 3 times and divided it.  I left 2 clips in place.  I treated the cystic duct in a similar fashion.  The duct was viable and the clips completely traversed the duct.  I then took the gallbladder off the liver    Irrigation was performed.  Hemostasis was obtained.  I did place 2 pieces of Surgicel snow in the  gallbladder bed as it was very friable.  I then closed the umbilical incision with a 0 Vicryl as well as another 0 Vicryl using the suture passer device.  This completely obliterated the defect.  I then removed remaining trocars and desufflated the abdomen. I closed incisions with 4-0 monocryl and glue. She tolerated well, was transferred to recovery stable.

## 2024-05-24 NOTE — ED Provider Notes (Signed)
 Patient has scheduled OR time for cholecystectomy.  She has had rebound and nausea and pain.  Patient is tearful and reports pain has gotten worse.  Last dose of morphine  was 2 hours earlier. Physical Exam  BP 105/88 (BP Location: Right Wrist)   Pulse 83   Temp 97.7 F (36.5 C) (Oral)   Resp 18   SpO2 100%   Physical Exam  Procedures  Procedures  ED Course / MDM   Clinical Course as of 05/24/24 0950  Wed May 23, 2024  1938 Patient was reassessed and still continues to have significant right upper quadrant epigastric pain on exam.  Her presentation is concerning for acute cholecystitis.  Given 1 of Rocephin  and will discuss with general surgery. [JB]  [id number] Discussed with Dr. Rubin however he is in trauma and will have to be tracked. [JB]    Clinical Course User Index [JB] Barrett, Warren SAILOR, PA-C   Medical Decision Making Amount and/or Complexity of Data Reviewed Labs: ordered. Radiology: ordered.  Risk OTC drugs. Prescription drug management. Decision regarding hospitalization.   Patient is nontoxic and alert.  Stable vital signs.  Will administer Dilaudid  1 mg IV and Zofran  4 mg IV for pain and nausea.       Jamie Canning, MD 05/24/24 [phone]

## 2024-05-24 NOTE — ED Notes (Signed)
 Spoke with Lauraine in [facility]. Pt to arrive to admitting at 10:30am, stress NPO

## 2024-05-24 NOTE — Transfer of Care (Signed)
 Immediate Anesthesia Transfer of Care Note  Patient: Jamie Simmons  Procedure(s) Performed: LAPAROSCOPIC CHOLECYSTECTOMY (Abdomen)  Patient Location: PACU  Anesthesia Type:General  Level of Consciousness: drowsy and patient cooperative  Airway & Oxygen Therapy: Patient Spontanous Breathing  Post-op Assessment: Report given to RN and Post -op Vital signs reviewed and stable  Post vital signs: Reviewed and stable  Last Vitals:  Vitals Value Taken Time  BP 117/65 05/24/24 14:07  Temp    Pulse 84 05/24/24 14:09  Resp 17 05/24/24 14:09  SpO2 97 % 05/24/24 14:09  Vitals shown include unfiled device data.  Last Pain:  Vitals:   05/24/24 1120  TempSrc:   PainSc: 5          Complications: No notable events documented.

## 2024-05-25 ENCOUNTER — Encounter (HOSPITAL_COMMUNITY): Payer: Self-pay | Admitting: General Surgery

## 2024-05-25 NOTE — Anesthesia Postprocedure Evaluation (Signed)
"   Anesthesia Post Note  Patient: Jamie Simmons  Procedure(s) Performed: LAPAROSCOPIC CHOLECYSTECTOMY (Abdomen)     Patient location during evaluation: PACU Anesthesia Type: General Level of consciousness: awake and alert Pain management: pain level controlled Vital Signs Assessment: post-procedure vital signs reviewed and stable Respiratory status: spontaneous breathing, nonlabored ventilation, respiratory function stable and patient connected to nasal cannula oxygen Cardiovascular status: blood pressure returned to baseline and stable Postop Assessment: no apparent nausea or vomiting Anesthetic complications: no   No notable events documented.  Last Vitals:  Vitals:   05/24/24 1559 05/24/24 1600  BP:  110/61  Pulse: 91 91  Resp:  16  Temp:    SpO2:  97%    Last Pain:  Vitals:   05/24/24 1600  TempSrc:   PainSc: 5                  Zaidan Keeble P Milayah Krell      "

## 2024-05-28 LAB — SURGICAL PATHOLOGY
